# Patient Record
Sex: Female | Born: 1980 | Race: White | Hispanic: No | Marital: Married | Smoking: Current every day smoker
Health system: Southern US, Community
[De-identification: ages and names within clinical notes are randomized; demographics above are authoritative.]

## PROBLEM LIST (undated history)

## (undated) ENCOUNTER — Inpatient Hospital Stay (HOSPITAL_COMMUNITY): Payer: Self-pay

## (undated) DIAGNOSIS — R87629 Unspecified abnormal cytological findings in specimens from vagina: Secondary | ICD-10-CM

## (undated) DIAGNOSIS — G8929 Other chronic pain: Secondary | ICD-10-CM

## (undated) DIAGNOSIS — F32A Depression, unspecified: Secondary | ICD-10-CM

## (undated) DIAGNOSIS — Z62810 Personal history of physical and sexual abuse in childhood: Secondary | ICD-10-CM

## (undated) DIAGNOSIS — J45909 Unspecified asthma, uncomplicated: Secondary | ICD-10-CM

## (undated) DIAGNOSIS — K589 Irritable bowel syndrome without diarrhea: Secondary | ICD-10-CM

## (undated) DIAGNOSIS — M549 Dorsalgia, unspecified: Secondary | ICD-10-CM

## (undated) DIAGNOSIS — M5126 Other intervertebral disc displacement, lumbar region: Secondary | ICD-10-CM

## (undated) DIAGNOSIS — F329 Major depressive disorder, single episode, unspecified: Secondary | ICD-10-CM

## (undated) DIAGNOSIS — F1920 Other psychoactive substance dependence, uncomplicated: Secondary | ICD-10-CM

## (undated) DIAGNOSIS — Z62812 Personal history of neglect in childhood: Secondary | ICD-10-CM

## (undated) DIAGNOSIS — J9383 Other pneumothorax: Secondary | ICD-10-CM

## (undated) DIAGNOSIS — F419 Anxiety disorder, unspecified: Secondary | ICD-10-CM

## (undated) DIAGNOSIS — Z62811 Personal history of psychological abuse in childhood: Secondary | ICD-10-CM

## (undated) HISTORY — DX: Personal history of psychological abuse in childhood: Z62.811

## (undated) HISTORY — DX: Other intervertebral disc displacement, lumbar region: M51.26

## (undated) HISTORY — DX: Personal history of neglect in childhood: Z62.812

## (undated) HISTORY — PX: BREAST MASS EXCISION: SHX1267

## (undated) HISTORY — PX: CHEST TUBE INSERTION: SHX231

## (undated) HISTORY — DX: Irritable bowel syndrome without diarrhea: K58.9

## (undated) HISTORY — PX: LEEP: SHX91

## (undated) HISTORY — PX: NO PAST SURGERIES: SHX2092

## (undated) HISTORY — DX: Personal history of physical and sexual abuse in childhood: Z62.810

---

## 1998-12-24 ENCOUNTER — Emergency Department (HOSPITAL_COMMUNITY): Admission: EM | Admit: 1998-12-24 | Discharge: 1998-12-24 | Payer: Self-pay | Admitting: Emergency Medicine

## 2000-08-06 ENCOUNTER — Ambulatory Visit (HOSPITAL_COMMUNITY): Admission: RE | Admit: 2000-08-06 | Discharge: 2000-08-06 | Payer: Self-pay | Admitting: Obstetrics and Gynecology

## 2000-08-06 ENCOUNTER — Encounter (INDEPENDENT_AMBULATORY_CARE_PROVIDER_SITE_OTHER): Payer: Self-pay

## 2001-02-26 ENCOUNTER — Other Ambulatory Visit: Admission: RE | Admit: 2001-02-26 | Discharge: 2001-02-26 | Payer: Self-pay | Admitting: Gynecology

## 2001-08-29 ENCOUNTER — Encounter (INDEPENDENT_AMBULATORY_CARE_PROVIDER_SITE_OTHER): Payer: Self-pay | Admitting: Specialist

## 2001-08-29 ENCOUNTER — Inpatient Hospital Stay (HOSPITAL_COMMUNITY): Admission: AD | Admit: 2001-08-29 | Discharge: 2001-09-01 | Payer: Self-pay | Admitting: Gynecology

## 2001-10-15 ENCOUNTER — Other Ambulatory Visit: Admission: RE | Admit: 2001-10-15 | Discharge: 2001-10-15 | Payer: Self-pay | Admitting: Gynecology

## 2002-12-14 ENCOUNTER — Other Ambulatory Visit: Admission: RE | Admit: 2002-12-14 | Discharge: 2002-12-14 | Payer: Self-pay | Admitting: *Deleted

## 2003-09-22 ENCOUNTER — Other Ambulatory Visit: Admission: RE | Admit: 2003-09-22 | Discharge: 2003-09-22 | Payer: Self-pay | Admitting: Obstetrics and Gynecology

## 2004-06-05 ENCOUNTER — Other Ambulatory Visit: Admission: RE | Admit: 2004-06-05 | Discharge: 2004-06-05 | Payer: Self-pay | Admitting: Obstetrics and Gynecology

## 2005-01-16 ENCOUNTER — Other Ambulatory Visit: Admission: RE | Admit: 2005-01-16 | Discharge: 2005-01-16 | Payer: Self-pay | Admitting: Obstetrics and Gynecology

## 2005-04-10 ENCOUNTER — Other Ambulatory Visit: Admission: RE | Admit: 2005-04-10 | Discharge: 2005-04-10 | Payer: Self-pay | Admitting: Obstetrics and Gynecology

## 2005-10-02 ENCOUNTER — Other Ambulatory Visit: Admission: RE | Admit: 2005-10-02 | Discharge: 2005-10-02 | Payer: Self-pay | Admitting: Obstetrics and Gynecology

## 2010-03-03 ENCOUNTER — Ambulatory Visit: Payer: Self-pay | Admitting: Family Medicine

## 2010-03-03 LAB — CONVERTED CEMR LAB
Glucose, Urine, Semiquant: NEGATIVE
Ketones, urine, test strip: NEGATIVE
Nitrite: NEGATIVE
Specific Gravity, Urine: 1.015
WBC Urine, dipstick: NEGATIVE

## 2010-12-14 ENCOUNTER — Encounter
Admission: RE | Admit: 2010-12-14 | Discharge: 2010-12-14 | Payer: Self-pay | Source: Home / Self Care | Attending: Gastroenterology | Admitting: Gastroenterology

## 2011-01-23 NOTE — Assessment & Plan Note (Signed)
Summary: ABDOMINAL PAIN/KH   Vital Signs:  Patient Profile:   30 Years Old Female CC:      left side pain aching with shooting pain X 4 days with urianry frequency and urgency Height:     68 inches Weight:      160 pounds O2 Sat:      99 % O2 treatment:    Room Air Temp:     98.9 degrees F oral Pulse rate:   87 / minute Resp:     18 per minute BP sitting:   126 / 70  (right arm)  Pt. in pain?   yes    Location:   left side    Intensity:   6    Type:       aching  Vitals Entered By: Lajean Saver RN (March 03, 2010 1:03 PM)                   Current Allergies: No known allergies History of Present Illness Chief Complaint: left side pain aching with shooting pain X 4 days with Joy Murray frequency and urgency History of Present Illness: Subjective:  Patient complains of increased urinary frequency and nocturia for about a month without dysuria.  Over the past 4 days she has had vague discomfort in her left upper abdomen that radiates to her left flank. No fever/chills.  No cough or shortness of breath.  No hematuria.  No nausea/vomiting.  No change in bowel movements.  No vaginal discharge or pelvic pain.  She has had amenorrhea since placement of an IUD 8 years ago.  She has a history of abnormal Pap smear several years ago with subsequent cervical conization.  A repeat Pap smear about 2 years ago was normal.  No weight loss.  REVIEW OF SYSTEMS Constitutional Symptoms      Denies fever, chills, night sweats, weight loss, weight gain, and fatigue.      Comments: pain to left side Eyes       Denies change in vision, eye pain, eye discharge, glasses, contact lenses, and eye surgery. Ear/Nose/Throat/Mouth       Denies hearing loss/aids, change in hearing, ear pain, ear discharge, dizziness, frequent runny nose, frequent nose bleeds, sinus problems, sore throat, hoarseness, and tooth pain or bleeding.  Respiratory       Denies dry cough, productive cough, wheezing, shortness of  breath, asthma, bronchitis, and emphysema/COPD.  Cardiovascular       Denies murmurs, chest pain, and tires easily with exhertion.    Gastrointestinal       Denies stomach pain, nausea/vomiting, diarrhea, constipation, blood in bowel movements, and indigestion. Genitourniary       Complains of loss of urinary control.      Denies painful urination and kidney stones. Neurological       Denies paralysis, seizures, and fainting/blackouts. Musculoskeletal       Denies muscle pain, joint pain, joint stiffness, decreased range of motion, redness, swelling, muscle weakness, and gout.  Skin       Denies bruising, unusual mles/lumps or sores, and hair/skin or nail changes.  Psych       Denies mood changes, temper/anger issues, anxiety/stress, speech problems, depression, and sleep problems. 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  No adenopathy is present.  No thyromegaly is present  Lungs:  Clear to auscultation.  Breath sounds are equal.  Heart:  Regular rate and rhythm without murmurs, rubs, or gallops.  Abdomen:   Vague tenderness over uterus and bladder without masses or hepatosplenomegaly.  Bowel sounds are present.   Vague left flank tenderness. Genitourinary:  Vulva appears normal without lesions or erythema.  Vagina has normal mucosae without lesions.  There is a small amount of thin grayish discharge in the vaginal vault.  Cervix appears normal without lesions and no discharge.   IUD string is present in cervical os.  No cervical motion tenderness present.  Uterus seems somewhat full and slightly tender to manipulation.   Adnexae also slightly tender to manipulation but no masses.  Rectal exam negative urinalysis (dipstick):  negative Urine pregnancy test:  negative Assessment New Problems: PELVIC  PAIN (ICD-789.09)   Plan New Medications/Changes: LORTAB 5 5-500 MG TABS (HYDROCODONE-ACETAMINOPHEN) One tab by mouth hs as needed pain  #8 (eight) x 0, 03/03/2010, Donna Christen MD METRONIDAZOLE 500 MG TABS (METRONIDAZOLE) One by mouth bid  #14 x 0, 03/03/2010, Donna Christen MD  New Orders: Urinalysis [81003-65000] Pelvic Ultrasound [Sono Pelvis] T-Pap Smear, Thin Prep [99371] Urine Pregnancy Test  [81025] New Patient Level III [69678] Planning Comments:   Pap smear done. Will treat empirically for bacterial vaginosis.  Analgesic for bedtime. Schedule pelvic ultrasound. Return for worsening symptoms  Follow-up with GYN   The patient and/or caregiver has been counseled thoroughly with regard to medications prescribed including dosage, schedule, interactions, rationale for use, and possible side effects and they verbalize understanding.  Diagnoses and expected course of recovery discussed and will return if not improved as expected or if the condition worsens. Patient and/or caregiver verbalized understanding.  Prescriptions: LORTAB 5 5-500 MG TABS (HYDROCODONE-ACETAMINOPHEN) One tab by mouth hs as needed pain  #8 (eight) x 0   Entered and Authorized by:   Donna Christen MD   Signed by:   Donna Christen MD on 03/03/2010   Method used:   Print then Give to Patient   RxID:   [id number] METRONIDAZOLE 500 MG TABS (METRONIDAZOLE) One by mouth bid  #14 x 0   Entered and Authorized by:   Donna Christen MD   Signed by:   Donna Christen MD on 03/03/2010   Method used:   Print then Give to Patient   RxID:   [id number]   Patient Instructions: 1)  May take  Ibuprofen 200mg , 4 tabs every 8 hours with food   Laboratory Results   Urine Tests  Date/Time Received: March 03, 2010 1:35 PM  Date/Time Reported: March 03, 2010 1:35 PM   Routine Urinalysis   Color: yellow Appearance: Clear Glucose: negative   (Normal Range: Negative) Bilirubin: negative   (Normal Range: Negative) Ketone: negative   (Normal Range: Negative) Spec. Gravity: 1.015   (Normal Range: 1.003-1.035) Blood: trace-intact   (Normal Range: Negative) pH: 6.5   (Normal Range: 5.0-8.0) Protein: negative   (Normal Range: Negative) Urobilinogen: 1.0   (Normal Range: 0-1) Nitrite: negative   (Normal Range: Negative) Leukocyte Esterace: negative   (Normal Range: Negative)    Urine HCG: negative

## 2011-05-11 NOTE — Discharge Summary (Signed)
[facility]  Patient:    Joy Murray, PRIM Visit Number: [id number] MRN: [id number]          Service Type: OBS Location: 910A [id number] Attending Physician:  Tonye Royalty Dictated by:   Antony Contras, [facility] Admit Date:  08/29/2001 Discharge Date: 09/01/2001                             Discharge Summary  DISCHARGE DIAGNOSES:          1. Intrauterine pregnancy at 38-6/7 weeks.                               2. Premature rupture of membranes.  PROCEDURES:                   1. Induction of labor.                               2. Vacuum assisted vaginal delivery of viable                                  infant over a midline episiotomy.  HISTORY OF PRESENT ILLNESS:   The patient is a 30 year old, gravida 3, para 1-0-1-1, LMP November 30, 2000, [facility] September 06, 2001. Prenatal course was uncomplicated.  PRENATAL LABORATORY DATA:     Blood type A positive, antibody screen negative, RPR, HBsAg, HIV nonreactive. MSAFP normal. GBS negative.  HOSPITAL COURSE AND TREATMENT:                The patient presented on August 29, 2001 with a two-day history of leaking fluid. She did at that time have a positive fern test and Nitrazine. Cervix was 50%, closed, -2 station. She was admitted for induction of labor. Artificial rupture of membranes revealed thick meconium-stained fluid. IUPC was placed and amnioinfusion was started. She progressed to complete dilatation. Delivery was accomplished with a Tender Touch vacuum over midline episiotomy. She delivered an Apgar 8/9 female infant weighing 7 pounds. Pediatric team was in attendance. Cord pH was 7.29.  POSTPARTUM COURSE:            She remained afebrile and was able to be discharged on her second postpartum day in satisfactory condition.  CBC:  Hematocrit 32.9, hemoglobin 11.6, platelets 257,000.  DISPOSITION:                  The patient is to follow up in six weeks, continue prenatal vitamins and iron  with Motrin and Tylox for pain. Dictated by:   Antony Contras, [facility] Attending Physician:  Tonye Royalty DD:  09/26/01 TD:  09/28/01 Job: [id number]

## 2011-05-11 NOTE — Op Note (Signed)
[facility]  Patient:    Joy Murray, Joy Murray                            MRN: [id number] Proc. Date: 08/06/00 Adm. Date:  [id number] Attending:  Michaele Offer                           Operative Report  PREOPERATIVE DIAGNOSIS:  Missed abortion.  POSTOPERATIVE DIAGNOSIS:  Missed abortion.  PROCEDURE:  Suction dilation and curettage.  SURGEON:  Lavina Hamman, M.D.  ANESTHESIA:  General with an LMA.  ESTIMATED BLOOD LOSS:  100 cc.  FINDINGS:  Eight week size anteverted uterus with a closed cervical os and products of conception were obtained on curettage.  COUNTS:  Correct.  CONDITION:  Stable.  DESCRIPTION OF PROCEDURE:  After appropriate informed consent was obtained, the patient was taken to the operating room and placed in the dorsal supine position. General anesthesia was induced and she was placed in mobile stirrups. Her perineum and vagina were prepped and draped in the usual sterile fashion and her bladder drained with a red rubber catheter. A weighted speculum was inserted into the vagina and the anterior lip of the cervix was grasped with a single tooth tenaculum. The uterus then sounded to approximately 9 cm. The cervix was gradually dilated to a size 18 Pratt dilator. A size 8 curved suction curette was then inserted and suction curettage performed with return of products of conception. Sharp curettage was then performed with a small amount of tissue obtained and after this was obtained there was good uterine cry in all quadrants. Suction curettage was performed one more time to remove all remaining blood. The single tooth tenaculum was removed and the uterus was massaged. The cervix was inspected and found to be hemostatic and the cervical os was closed. All instruments were removed from the vagina and the patient was taken down from stirrups. The patient was awakened in the operating room and taken to the recovery room in stable  condition after tolerating the procedure well. DD:  08/06/00 TD:  08/06/00 Job: [id number]

## 2011-09-17 ENCOUNTER — Encounter: Payer: Self-pay | Admitting: Family Medicine

## 2011-09-17 ENCOUNTER — Inpatient Hospital Stay (INDEPENDENT_AMBULATORY_CARE_PROVIDER_SITE_OTHER)
Admission: RE | Admit: 2011-09-17 | Discharge: 2011-09-17 | Disposition: A | Discharge status: Home / Self Care | Payer: Private Health Insurance - Indemnity | Source: Ambulatory Visit | Attending: Family Medicine | Admitting: Family Medicine

## 2011-09-17 ENCOUNTER — Other Ambulatory Visit: Payer: Self-pay | Admitting: Family Medicine

## 2011-09-17 ENCOUNTER — Ambulatory Visit
Admission: RE | Admit: 2011-09-17 | Discharge: 2011-09-17 | Disposition: A | Discharge status: Home / Self Care | Payer: Private Health Insurance - Indemnity | Source: Ambulatory Visit | Attending: Family Medicine | Admitting: Family Medicine

## 2011-09-17 DIAGNOSIS — J9801 Acute bronchospasm: Secondary | ICD-10-CM

## 2011-09-17 DIAGNOSIS — J069 Acute upper respiratory infection, unspecified: Secondary | ICD-10-CM

## 2011-09-17 DIAGNOSIS — R0602 Shortness of breath: Secondary | ICD-10-CM

## 2011-09-17 DIAGNOSIS — R05 Cough: Secondary | ICD-10-CM

## 2011-09-20 ENCOUNTER — Telehealth (INDEPENDENT_AMBULATORY_CARE_PROVIDER_SITE_OTHER): Payer: Self-pay | Admitting: *Deleted

## 2011-11-26 NOTE — Progress Notes (Signed)
Summary: bad cough/sob Room 5   Vital Signs:  Patient Profile:   30 Years Old Female CC:      Cough/SOB x 3 days worse yesterday Height:     68 inches Weight:      150 pounds O2 Sat:      84 % O2 treatment:    Room Air Temp:     98.6 degrees F oral Pulse rate:   104 / minute Pulse rhythm:   regular Resp:     18 per minute BP sitting:   86 / 51  (left arm) Cuff size:   regular  Vitals Entered By: Emilio Math (September 17, 2011 4:21 PM)                  Current Allergies: No known allergies History of Present Illness Chief Complaint: Cough/SOB x 3 days worse yesterday History of Present Illness:  Subjective: Patient complains of onset of URI symptoms 3 days ago with nasal congestion, scratchy throat, and runny nose.  Her cough increased and today she has developed dyspnea with exertion.  Cough is worse at night.  She has a history of spontaneous pneumothorax twice, last episode May 2012.  She had pneumonia in Feb 2012.  She continues to smoke 1 pack per day.  No history of asthma.   No pleuritic pain No wheezing + nasal congestion ? post-nasal drainage No sinus pain/pressure No itchy/red eyes No earache No hemoptysis + fever/chills/sweats No nausea No vomiting No abdominal pain No diarrhea No skin rashes + fatigue No myalgias + headache    Current Meds CELEXA 40 MG TABS (CITALOPRAM HYDROBROMIDE)  ABILIFY 10 MG TABS (ARIPIPRAZOLE)  ENABLEX 7.5 MG XR24H-TAB (DARIFENACIN HYDROBROMIDE)  VITAMIN D 400 UNIT TABS (CHOLECALCIFEROL)  CEFDINIR 300 MG CAPS (CEFDINIR) 1 by mouth q12hr PREDNISONE 10 MG TABS (PREDNISONE) 2 PO BID for 2 days, then 1 BID for 2 days, then 1 daily for 2 days.  Take PC.  Start Tuesday BENZONATATE 200 MG CAPS (BENZONATATE) One by mouth hs as needed cough PROAIR HFA 108 (90 BASE) MCG/ACT AERS (ALBUTEROL SULFATE) Two inhalations q4-6hr as needed.  Max 12 puffs/day  REVIEW OF SYSTEMS Constitutional Symptoms      Denies fever, chills, night  sweats, weight loss, weight gain, and fatigue.  Eyes       Denies change in vision, eye pain, eye discharge, glasses, contact lenses, and eye surgery. Ear/Nose/Throat/Mouth       Denies hearing loss/aids, change in hearing, ear pain, ear discharge, dizziness, frequent runny nose, frequent nose bleeds, sinus problems, sore throat, hoarseness, and tooth pain or bleeding.  Respiratory       Complains of dry cough and shortness of breath.      Denies productive cough, wheezing, asthma, bronchitis, and emphysema/COPD.  Cardiovascular       Denies murmurs, chest pain, and tires easily with exhertion.    Gastrointestinal       Denies stomach pain, nausea/vomiting, diarrhea, constipation, blood in bowel movements, and indigestion. Genitourniary       Denies painful urination, kidney stones, and loss of urinary control. Neurological       Denies paralysis, seizures, and fainting/blackouts. Musculoskeletal       Denies muscle pain, joint pain, joint stiffness, decreased range of motion, redness, swelling, muscle weakness, and gout.  Skin       Denies bruising, unusual mles/lumps or sores, and hair/skin or nail changes.  Psych       Denies mood changes, temper/anger  issues, anxiety/stress, speech problems, depression, and sleep problems. Other Comments: HX Lung collapse 2006 and 04/2011   Past History:  Past Medical History: spontaneous pneumothorax 2006 and 04/2011  Past Surgical History: Reviewed history from 03/03/2010 and no changes required. Denies surgical history  Family History: Reviewed history from 03/03/2010 and no changes required. 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  Extraocular movement is intact.  Conjunctivae  are not inflamed.  Ears:  Canals normal.  Tympanic membranes normal.   Nose:  Mildly congested turbinates.  No sinus tenderness  Pharynx:  Normal  Neck:  Supple.  No adenopathy is present.   Lungs:  Diffuse bilateral wheezes and rhonchi.   Breath sounds are equal.  Chest:  Tenderness over sternum Heart:  Regular rate and rhythm without murmurs, rubs, or gallops.  Abdomen:  Nontender without masses or hepatosplenomegaly.  Bowel sounds are present.  No CVA or flank tenderness.  Extremities:  No edema.  Skin:  No rash Chest X-ray:  IMPRESSION: No significant abnormality identified. Assessment New Problems: ACUTE BRONCHOSPASM (ICD-519.11) UPPER RESPIRATORY INFECTION, ACUTE, WITH BRONCHITIS (ICD-465.9) DYSPNEA (ICD-786.05) COUGH (ICD-786.2)   Plan New Medications/Changes: PROAIR HFA 108 (90 BASE) MCG/ACT AERS (ALBUTEROL SULFATE) Two inhalations q4-6hr as needed.  Max 12 puffs/day  #1 MDI x 0, 09/17/2011, Donna Christen MD BENZONATATE 200 MG CAPS (BENZONATATE) One by mouth hs as needed cough  #12 x 0, 09/17/2011, Donna Christen MD PREDNISONE 10 MG TABS (PREDNISONE) 2 PO BID for 2 days, then 1 BID for 2 days, then 1 daily for 2 days.  Take PC.  Start Tuesday  #14 x 0, 09/17/2011, Donna Christen MD CEFDINIR 300 MG CAPS (CEFDINIR) 1 by mouth q12hr  #20 x 0, 09/17/2011, Donna Christen MD  New Orders: T-Chest x-ray, 2 views [71020] Solumedrol up to 125mg  [J2930] CBC w/Diff [56213-08657] Pulse Oximetry [94760] Admin of Therapeutic Inj  intramuscular or subcutaneous [96372] Albuterol Sulfate Sol 1mg  unit dose [J7613] Ipratropium inhalation sol. unit dose [J7644] Nebulizer Tx [94640] Est. Patient Level V [id number] Planning Comments:   O2 by nasal cannula during evaluation.  Pulse ox back up to 94% after neb treatment (oxygen off) Solu Medrol 125mg , begin tapering course of prednisone tomorrow.  Begin ProAir inhaler. Begin Omnicef, expectorant/decongestant, cough suppressant at bedtime.  Increase  fluid intake, rest. Followup with PCP if not improving 7 to 10 days  Return for worsening symptoms (or proceed to ER)   The patient and/or caregiver has been counseled thoroughly with regard to medications prescribed including dosage, schedule, interactions, rationale for use, and possible side effects and they verbalize understanding.  Diagnoses and expected course of recovery discussed and will return if not improved as expected or if the condition worsens. Patient and/or caregiver verbalized understanding.  Prescriptions: PROAIR HFA 108 (90 BASE) MCG/ACT AERS (ALBUTEROL SULFATE) Two inhalations q4-6hr as needed.  Max 12 puffs/day  #1 MDI x 0   Entered and Authorized by:   Donna Christen MD   Signed by:   Donna Christen MD on 09/17/2011   Method used:   Print then Give to Patient   RxID:   [id number] BENZONATATE 200 MG CAPS (BENZONATATE) One by mouth hs as needed cough  #12 x 0   Entered and Authorized by:  Donna Christen MD   Signed by:   Donna Christen MD on 09/17/2011   Method used:   Print then Give to Patient   RxID:   [id number] PREDNISONE 10 MG TABS (PREDNISONE) 2 PO BID for 2 days, then 1 BID for 2 days, then 1 daily for 2 days.  Take PC.  Start Tuesday  #14 x 0   Entered and Authorized by:   Donna Christen MD   Signed by:   Donna Christen MD on 09/17/2011   Method used:   Print then Give to Patient   RxID:   [id number] CEFDINIR 300 MG CAPS (CEFDINIR) 1 by mouth q12hr  #20 x 0   Entered and Authorized by:   Donna Christen MD   Signed by:   Donna Christen MD on 09/17/2011   Method used:   Print then Give to Patient   RxID:   [id number]   Patient Instructions: 1)  Take Mucinex D (guaifenesin with decongestant) twice daily for congestion. 2)  Increase fluid intake, rest. 3)  May use Afrin nasal spray (or generic oxymetazoline) twice daily for about 5 days.  Also recommend using saline nasal spray several times daily and/or saline nasal irrigation. 4)  Followup  with family doctor if not improving 7 to 10 days.   Medication Administration  Injection # 1:    Medication: Solumedrol up to 125mg     Diagnosis: ACUTE BRONCHOSPASM (ICD-519.11)    Route: IM    Site: LUOQ gluteus    Exp Date: 04/23/2014    Lot #: [id number]    Mfr: Pfizer    Comments: 125 mgs given    Patient tolerated injection without complications    Given by: Emilio Math (September 17, 2011 6:00 PM)  Medication # 1:    Medication: Albuterol Sulfate Sol 1mg  unit dose    Diagnosis: ACUTE BRONCHOSPASM (ICD-519.11)    Dose: 0.5    Route: inhaled    Exp Date: 04/23/2012    Lot #: [id number]    Mfr: Mylan    Comments: DuoNeb    Patient tolerated medication without complications    Given by: Emilio Math (September 17, 2011 6:01 PM)  Medication # 2:    Medication: Ipratropium inhalation sol. unit dose    Diagnosis: ACUTE BRONCHOSPASM (ICD-519.11)    Dose: 3.0    Route: inhaled    Exp Date: 04/23/2012    Lot #: [id number]    Mfr: Mylan    Comments: Duoneb    Patient tolerated medication without complications    Given by: Emilio Math (September 17, 2011 6:03 PM)  Orders Added: 1)  T-Chest x-ray, 2 views [71020] 2)  Solumedrol up to 125mg  [J2930] 3)  CBC w/Diff [53664-40347] 4)  Pulse Oximetry [94760] 5)  Admin of Therapeutic Inj  intramuscular or subcutaneous [96372] 6)  Albuterol Sulfate Sol 1mg  unit dose [J7613] 7)  Ipratropium inhalation sol. unit dose [J7644] 8)  Nebulizer Tx [94640] 9)  Est. Patient Level V [42595]     Medication Administration  Injection # 1:    Medication: Solumedrol up to 125mg     Diagnosis: ACUTE BRONCHOSPASM (ICD-519.11)    Route: IM    Site: LUOQ gluteus    Exp Date: 04/23/2014    Lot #: [id number]    Mfr: Pfizer    Comments: 125 mgs given    Patient tolerated injection without complications    Given by: Emilio Math (September 17, 2011 6:00 PM)  Medication # 1:    Medication: Albuterol  Sulfate Sol 1mg  unit dose    Diagnosis: ACUTE  BRONCHOSPASM (ICD-519.11)    Dose: 0.5    Route: inhaled    Exp Date: 04/23/2012    Lot #: [id number]    Mfr: Mylan    Comments: DuoNeb    Patient tolerated medication without complications    Given by: Emilio Math (September 17, 2011 6:01 PM)  Medication # 2:    Medication: Ipratropium inhalation sol. unit dose    Diagnosis: ACUTE BRONCHOSPASM (ICD-519.11)    Dose: 3.0    Route: inhaled    Exp Date: 04/23/2012    Lot #: [id number]    Mfr: Mylan    Comments: Duoneb    Patient tolerated medication without complications    Given by: Emilio Math (September 17, 2011 6:03 PM)  Orders Added: 1)  T-Chest x-ray, 2 views [71020] 2)  Solumedrol up to 125mg  [J2930] 3)  CBC w/Diff [16109-60454] 4)  Pulse Oximetry [94760] 5)  Admin of Therapeutic Inj  intramuscular or subcutaneous [96372] 6)  Albuterol Sulfate Sol 1mg  unit dose [J7613] 7)  Ipratropium inhalation sol. unit dose [J7644] 8)  Nebulizer Tx [94640] 9)  Est. Patient Level V [09811]   Appended Document: bad cough/sob Room 5 CBC:  WBC 8.5 LY 18.0, MO 6.8, GR 75.2; Hgb 12.9

## 2011-11-26 NOTE — Telephone Encounter (Signed)
  Phone Note Outgoing Call Call back at [facility] Phone [phone]   Call placed by: Lajean Saver RN,  September 20, 2011 3:43 PM Call placed to: Patient Summary of Call: Callback: No answer. Message left to call with questions or concerns

## 2013-03-31 ENCOUNTER — Other Ambulatory Visit: Payer: Self-pay | Admitting: Pain Medicine

## 2013-03-31 DIAGNOSIS — M546 Pain in thoracic spine: Secondary | ICD-10-CM

## 2013-03-31 DIAGNOSIS — M545 Low back pain: Secondary | ICD-10-CM

## 2013-04-04 ENCOUNTER — Other Ambulatory Visit: Payer: Managed Care, Other (non HMO)

## 2013-04-15 ENCOUNTER — Ambulatory Visit
Admission: RE | Admit: 2013-04-15 | Discharge: 2013-04-15 | Disposition: A | Discharge status: Home / Self Care | Payer: BC Managed Care – PPO | Source: Ambulatory Visit | Attending: Pain Medicine | Admitting: Pain Medicine

## 2013-04-15 DIAGNOSIS — M545 Low back pain, unspecified: Secondary | ICD-10-CM

## 2013-04-15 DIAGNOSIS — M546 Pain in thoracic spine: Secondary | ICD-10-CM

## 2013-09-07 ENCOUNTER — Emergency Department (INDEPENDENT_AMBULATORY_CARE_PROVIDER_SITE_OTHER): Payer: BC Managed Care – PPO

## 2013-09-07 ENCOUNTER — Emergency Department
Admission: EM | Admit: 2013-09-07 | Discharge: 2013-09-07 | Disposition: A | Discharge status: Home / Self Care | Payer: BC Managed Care – PPO | Source: Home / Self Care | Attending: Family Medicine | Admitting: Family Medicine

## 2013-09-07 ENCOUNTER — Encounter: Payer: Self-pay | Admitting: *Deleted

## 2013-09-07 DIAGNOSIS — Z716 Tobacco abuse counseling: Secondary | ICD-10-CM

## 2013-09-07 DIAGNOSIS — R062 Wheezing: Secondary | ICD-10-CM

## 2013-09-07 DIAGNOSIS — J069 Acute upper respiratory infection, unspecified: Secondary | ICD-10-CM

## 2013-09-07 DIAGNOSIS — J40 Bronchitis, not specified as acute or chronic: Secondary | ICD-10-CM

## 2013-09-07 DIAGNOSIS — R05 Cough: Secondary | ICD-10-CM

## 2013-09-07 HISTORY — DX: Major depressive disorder, single episode, unspecified: F32.9

## 2013-09-07 HISTORY — DX: Other pneumothorax: J93.83

## 2013-09-07 HISTORY — DX: Anxiety disorder, unspecified: F41.9

## 2013-09-07 HISTORY — DX: Other chronic pain: G89.29

## 2013-09-07 HISTORY — DX: Dorsalgia, unspecified: M54.9

## 2013-09-07 HISTORY — DX: Depression, unspecified: F32.A

## 2013-09-07 MED ORDER — METHYLPREDNISOLONE SODIUM SUCC 125 MG IJ SOLR
125.0000 mg | Freq: Once | INTRAMUSCULAR | Status: AC
  Administered 2013-09-07: 125 mg via INTRAMUSCULAR

## 2013-09-07 MED ORDER — PREDNISONE 50 MG PO TABS: ORAL_TABLET | ORAL | Status: DC

## 2013-09-07 MED ORDER — AZITHROMYCIN 250 MG PO TABS: ORAL_TABLET | ORAL | Status: DC

## 2013-09-07 MED ORDER — BENZONATATE 100 MG PO CAPS: 100.0000 mg | ORAL_CAPSULE | Freq: Three times a day (TID) | ORAL | Status: DC | PRN

## 2013-09-07 NOTE — ED Provider Notes (Signed)
CSN: [id number]     Arrival date & time 09/07/13  1911 History   First MD Initiated Contact with Patient 09/07/13 1913     Chief Complaint  Patient presents with  . Cough  . Nasal Congestion  . Headache    HPI  URI Symptoms Onset: 2-3 days  Description: rhinorrhea, nasal congestion, cough, SOB, wheezing Modifying factors:  1/2 PPD smoker, prior hx/o PTX x2, last occurrence 3 years ago.   Symptoms Nasal discharge: yes Fever: no Sore throat: no Cough: yes Wheezing: yes Ear pain: no GI symptoms: no Sick contacts: no  Red Flags  Stiff neck: no Dyspnea: yes Rash: no Swallowing difficulty: no  Sinusitis Risk Factors Headache/face pain: no Double sickening: no tooth pain: no  Allergy Risk Factors Sneezing: no Itchy scratchy throat: no Seasonal symptoms: no  Flu Risk Factors Headache: no muscle aches: no severe fatigue: no   No past medical history on file. No past surgical history on file. No family history on file. History  Substance Use Topics  . Smoking status: Not on file  . Smokeless tobacco: Not on file  . Alcohol Use: Not on file   OB History   No data available     Review of Systems  All other systems reviewed and are negative.    Allergies  Review of patient's allergies indicates no known allergies.  Home Medications   Current Outpatient Rx  Name  Route  Sig  Dispense  Refill  . ALPRAZolam (XANAX) 0.5 MG tablet   Oral   Take 0.5 mg by mouth at bedtime as needed for sleep.         . ARIPiprazole (ABILIFY) 30 MG tablet   Oral   Take 50 mg by mouth daily.         . Cholecalciferol (VITAMIN D PO)   Oral   Take by mouth.         Marland Kitchen HYDROcodone-acetaminophen (LORTAB) 10-500 MG per tablet   Oral   Take 1 tablet by mouth every 6 (six) hours as needed for pain.          BP 113/73  Pulse 96  Temp(Src) 98.2 F (36.8 C) (Oral)  Resp 18  Ht 5\' 8"  (1.727 m)  Wt 152 lb (68.947 kg)  BMI 23.12 kg/m2  SpO2 98% Physical Exam   Constitutional: She appears well-developed and well-nourished.  HENT:  Head: Normocephalic and atraumatic.  Right Ear: External ear normal.  Left Ear: External ear normal.  +nasal erythema, rhinorrhea bilaterally, + post oropharyngeal erythema    Eyes: Conjunctivae are normal. Pupils are equal, round, and reactive to light.  Neck: Normal range of motion. Neck supple.  Cardiovascular: Normal rate and regular rhythm.   Pulmonary/Chest: Effort normal. She has wheezes. She has rales.  Abdominal: Soft.  Musculoskeletal: Normal range of motion.  Neurological: She is alert.  Skin: Skin is warm.    ED Course  Procedures (including critical care time) Labs Review Labs Reviewed - No data to display Imaging Review No results found.  MDM   1. URI (upper respiratory infection)   2. Bronchitis   3. Wheezing   4. Tobacco abuse counseling    Likely viral induced obstructive lung disease exacerbation. X-ray without any signs of pneumothorax. Solu-Medrol 125 mg IM x1 for wheezing. Will place on a short course of prednisone. Z-Pak for atypical coverage. Tessalon Perles for cough. Discuss infectious as well as pulmonary red flags. If patient develops any sudden onset of severe shortness  of breath patient is to go to the hospital for review to be evaluated for pneumothorax. Patient expressed understanding of plan. Followup as needed.     The patient and/or caregiver has been counseled thoroughly with regard to treatment plan and/or medications prescribed including dosage, schedule, interactions, rationale for use, and possible side effects and they verbalize understanding. Diagnoses and expected course of recovery discussed and will return if not improved as expected or if the condition worsens. Patient and/or caregiver verbalized understanding.         Doree Albee, MD 10/01/13 1143

## 2013-09-07 NOTE — ED Notes (Signed)
Pt c/o productive cough, nasal congestion, HA and center of chest hurts x 3 days. She has taken Theraflu. Denies fever.

## 2013-10-20 DIAGNOSIS — M5126 Other intervertebral disc displacement, lumbar region: Secondary | ICD-10-CM

## 2013-10-20 HISTORY — DX: Other intervertebral disc displacement, lumbar region: M51.26

## 2014-01-10 DIAGNOSIS — G894 Chronic pain syndrome: Secondary | ICD-10-CM | POA: Insufficient documentation

## 2014-04-20 ENCOUNTER — Encounter (HOSPITAL_COMMUNITY): Payer: Self-pay | Admitting: Psychology

## 2014-04-21 ENCOUNTER — Other Ambulatory Visit (HOSPITAL_COMMUNITY): Payer: BC Managed Care – PPO | Attending: Psychiatry

## 2014-04-21 DIAGNOSIS — F1910 Other psychoactive substance abuse, uncomplicated: Secondary | ICD-10-CM

## 2014-04-21 DIAGNOSIS — Z62812 Personal history of neglect in childhood: Secondary | ICD-10-CM

## 2014-04-21 DIAGNOSIS — F10239 Alcohol dependence with withdrawal, unspecified: Secondary | ICD-10-CM

## 2014-04-21 DIAGNOSIS — F11229 Opioid dependence with intoxication, unspecified: Secondary | ICD-10-CM

## 2014-04-21 DIAGNOSIS — Z62811 Personal history of psychological abuse in childhood: Secondary | ICD-10-CM

## 2014-04-21 DIAGNOSIS — F1120 Opioid dependence, uncomplicated: Secondary | ICD-10-CM | POA: Insufficient documentation

## 2014-04-21 DIAGNOSIS — Z62810 Personal history of physical and sexual abuse in childhood: Secondary | ICD-10-CM

## 2014-04-21 DIAGNOSIS — F4310 Post-traumatic stress disorder, unspecified: Secondary | ICD-10-CM

## 2014-04-21 DIAGNOSIS — F1920 Other psychoactive substance dependence, uncomplicated: Secondary | ICD-10-CM

## 2014-04-21 DIAGNOSIS — Z8659 Personal history of other mental and behavioral disorders: Secondary | ICD-10-CM

## 2014-04-21 DIAGNOSIS — F1994 Other psychoactive substance use, unspecified with psychoactive substance-induced mood disorder: Secondary | ICD-10-CM | POA: Insufficient documentation

## 2014-04-21 HISTORY — DX: Personal history of neglect in childhood: Z62.812

## 2014-04-21 HISTORY — DX: Personal history of physical and sexual abuse in childhood: Z62.810

## 2014-04-21 HISTORY — DX: Personal history of psychological abuse in childhood: Z62.811

## 2014-04-21 MED ORDER — CITALOPRAM HYDROBROMIDE 40 MG PO TABS: 40.0000 mg | ORAL_TABLET | Freq: Every day | ORAL | Status: DC

## 2014-04-21 MED ORDER — HYDROXYZINE PAMOATE 25 MG PO CAPS
25.0000 mg | ORAL_CAPSULE | Freq: Four times a day (QID) | ORAL | Status: DC | PRN
Start: 2014-04-21 — End: 2018-08-20

## 2014-04-21 MED ORDER — TRAZODONE HCL 50 MG PO TABS: ORAL_TABLET | ORAL | Status: DC

## 2014-04-21 MED ORDER — ARIPIPRAZOLE 10 MG PO TABS: 10.0000 mg | ORAL_TABLET | Freq: Every day | ORAL | Status: DC

## 2014-04-21 MED ORDER — ACAMPROSATE CALCIUM 333 MG PO TBEC: 666.0000 mg | DELAYED_RELEASE_TABLET | Freq: Three times a day (TID) | ORAL | Status: DC
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
## 2014-04-23 ENCOUNTER — Other Ambulatory Visit (HOSPITAL_COMMUNITY): Payer: BC Managed Care – PPO | Attending: Psychiatry | Admitting: Psychology

## 2014-04-23 DIAGNOSIS — F411 Generalized anxiety disorder: Secondary | ICD-10-CM | POA: Insufficient documentation

## 2014-04-23 DIAGNOSIS — F4310 Post-traumatic stress disorder, unspecified: Secondary | ICD-10-CM | POA: Insufficient documentation

## 2014-04-23 DIAGNOSIS — F1920 Other psychoactive substance dependence, uncomplicated: Secondary | ICD-10-CM | POA: Insufficient documentation

## 2014-04-23 DIAGNOSIS — F3289 Other specified depressive episodes: Secondary | ICD-10-CM | POA: Insufficient documentation

## 2014-04-23 DIAGNOSIS — F329 Major depressive disorder, single episode, unspecified: Secondary | ICD-10-CM | POA: Insufficient documentation

## 2014-04-23 DIAGNOSIS — F1120 Opioid dependence, uncomplicated: Secondary | ICD-10-CM

## 2014-04-23 DIAGNOSIS — F1020 Alcohol dependence, uncomplicated: Secondary | ICD-10-CM | POA: Insufficient documentation

## 2014-04-23 DIAGNOSIS — IMO0002 Reserved for concepts with insufficient information to code with codable children: Secondary | ICD-10-CM | POA: Insufficient documentation

## 2014-04-23 DIAGNOSIS — Z8659 Personal history of other mental and behavioral disorders: Secondary | ICD-10-CM

## 2014-04-23 DIAGNOSIS — F1420 Cocaine dependence, uncomplicated: Secondary | ICD-10-CM | POA: Insufficient documentation

## 2014-04-26 ENCOUNTER — Other Ambulatory Visit (HOSPITAL_COMMUNITY): Payer: BC Managed Care – PPO

## 2014-04-26 ENCOUNTER — Telehealth (HOSPITAL_COMMUNITY): Payer: Self-pay | Admitting: Psychology

## 2014-04-26 ENCOUNTER — Encounter (HOSPITAL_COMMUNITY): Payer: Self-pay | Admitting: Psychology

## 2014-04-26 ENCOUNTER — Telehealth (HOSPITAL_COMMUNITY): Payer: Self-pay | Admitting: *Deleted

## 2014-04-26 NOTE — Progress Notes (Signed)
    Daily Group Progress Note  Program: CD-IOP   Group Time: 1-2:30 pm  Participation Level: Minimal  Behavioral Response: Sharing  Type of Therapy: Process Group  Topic: Group process. The first part of group was spent in process. Members shared about current issues and concerns. A new member 'introduced' herself and gave the group a little about her history and what she needs now. Another member shared about his concerns about the upcoming weekend. Another member arrived late and disclosed very personal things about the night before and, indirectly, her childhood. The session was very personal with members becoming very vulnerable through their sharing.   Group Time: 2:45- 4pm  Participation Level: Minimal  Behavioral Response: Sharing  Type of Therapy: Psycho-education Group  Topic: Psycho-Ed: the second half of group was spent in an educational piece on "12-Step Slogans". A handout was provided to group members identifying over 50 AA slogans. The group read over the handout and discussed each slogan. Individual members were chosen and they explained, to the best of their ability, what the slogan meant. The session was informative and funny with members sharing their interpretations, many of which were not very accurate. As the session came to an end, members observed and celebrated a graduation. There were kind and hopeful words shared as the members said good-bye.   Summary: The patient was present for her second group session, but she had not spoken much or introduced herself in the previous session. She shared her story and it included many years of addiction, primarily benzos and opiates. The patient described her treatment in [place] and return this past Saturday. She had meet with the program director on Wednesday and was able to fill most of the medications she had been taking while in residential treatment. The patient shared that she has two children and they are engaged and enjoy  a good relationship. The patient had some feedback during the session on 'slogans' and she responded well to this intervention. The patient reported her sobriety date is 3/27. Her drug test result from Wednesday was positive for barbiturates. We will explore her records from the treatment center in [place].    Family Program: Family present? No   Name of family member(s):   UDS collected: No Results:   AA/NA attended?: No, but she is new to the program  Sponsor?: No   Lonald Troiani, LCAS
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
## 2014-04-28 ENCOUNTER — Other Ambulatory Visit (HOSPITAL_COMMUNITY): Payer: BC Managed Care – PPO | Admitting: *Deleted

## 2014-04-30 ENCOUNTER — Other Ambulatory Visit (HOSPITAL_COMMUNITY): Payer: BC Managed Care – PPO | Admitting: *Deleted

## 2014-05-01 ENCOUNTER — Encounter (HOSPITAL_COMMUNITY): Payer: Self-pay

## 2014-05-03 ENCOUNTER — Other Ambulatory Visit (HOSPITAL_COMMUNITY): Payer: BC Managed Care – PPO | Admitting: *Deleted

## 2014-05-05 ENCOUNTER — Other Ambulatory Visit (HOSPITAL_COMMUNITY): Payer: BC Managed Care – PPO | Admitting: Psychology

## 2014-05-05 DIAGNOSIS — Z8659 Personal history of other mental and behavioral disorders: Secondary | ICD-10-CM

## 2014-05-05 DIAGNOSIS — F1120 Opioid dependence, uncomplicated: Secondary | ICD-10-CM

## 2014-05-05 DIAGNOSIS — F1920 Other psychoactive substance dependence, uncomplicated: Principal | ICD-10-CM

## 2014-05-06 ENCOUNTER — Encounter (HOSPITAL_COMMUNITY): Payer: Self-pay | Admitting: Psychology
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
## 2014-05-07 ENCOUNTER — Other Ambulatory Visit (HOSPITAL_COMMUNITY): Payer: BC Managed Care – PPO | Admitting: Psychology

## 2014-05-07 DIAGNOSIS — Z62811 Personal history of psychological abuse in childhood: Secondary | ICD-10-CM

## 2014-05-07 DIAGNOSIS — F1920 Other psychoactive substance dependence, uncomplicated: Principal | ICD-10-CM

## 2014-05-07 DIAGNOSIS — F1120 Opioid dependence, uncomplicated: Secondary | ICD-10-CM

## 2014-05-07 DIAGNOSIS — Z62810 Personal history of physical and sexual abuse in childhood: Secondary | ICD-10-CM

## 2014-05-09 ENCOUNTER — Encounter (HOSPITAL_COMMUNITY): Payer: Self-pay | Admitting: Psychology

## 2014-05-09 NOTE — Progress Notes (Signed)
    Daily Group Progress Note  Program: CD-IOP   Group Time: 1-2:30 pm  Participation Level: Minimal  Behavioral Response: Sharing  Type of Therapy: Process Group  Topic: Group Process: the first part of group was spent in process. Members began the session with a check-in followed by a 5 minute breathing/meditation session. Members were then invited to share about current issues and concerns. One member seemed to be crying and the group invited her to share. She disclosed the events earlier today with her as of today, 788 yo daughter. She expressed frustration and fears about her daughter's tendency to overreact and get upset at minor things. The group processed this event and behavior and she received good feedback. A new member was present and he shared a little about himself. At only 33 years of age, his new group members were in awe that he was ready and accepting treatment for his chemical dependency. The first session included good sharing and disclosures among the group. During this time the program director was meeting patients for discharge, medication follow-up, and an initial session with a new member.  Group Time: 2:45- 4pm  Participation Level: Minimal  Behavioral Response: withdrawn, attentive, but not sharing or engaged  Type of Therapy: Psycho-education Group  Topic: Psycho-Ed/Graduation: the second half of group was spent in a psycho-ed about the developmental process of recovery. Members were provided a handout and they read it together. It identified the difference feelings as well as tasks during the first 18 months of recovery. Members who had achieved previous periods of sobriety shared their own experiences while newer members to recovery identified the "pink cloud", 'reality' and other earlier recovery experiences. At the conclusion of the session, a graduation ceremony was held. Members shared kind words and hopes with the graduating member and she provided wonderful  and encouraging words for the program and people she is leaving.   Summary: The patient did not offer anything, but described how she was doing as the "same bullshit, just a different day". She admitted she had worked out at [employer] gym, but couldn't really account for what she had been doing. When another member questioned her about attending meetings, she admitted she had not gone to any since the last session. She had no explanation when another asked why she hadn't gone. The patient provided little of herself and little if any feedback or comments in the session. The patient offered respect and admiration for the graduating member. Her sobriety date is 3/28, but unless she is able to get involved in the Fellowship, she will not be in the program for long.    Family Program: Family present? No   Name of family member(s):   UDS collected: No Results:   AA/NA attended?: No  Sponsor?: No   Lupita Rosales, LCAS

## 2014-05-10 ENCOUNTER — Other Ambulatory Visit (HOSPITAL_COMMUNITY): Payer: BC Managed Care – PPO | Admitting: Psychology

## 2014-05-10 DIAGNOSIS — F1120 Opioid dependence, uncomplicated: Secondary | ICD-10-CM

## 2014-05-10 DIAGNOSIS — F1920 Other psychoactive substance dependence, uncomplicated: Principal | ICD-10-CM

## 2014-05-10 DIAGNOSIS — Z62810 Personal history of physical and sexual abuse in childhood: Secondary | ICD-10-CM

## 2014-05-10 DIAGNOSIS — Z62811 Personal history of psychological abuse in childhood: Secondary | ICD-10-CM

## 2014-05-10 DIAGNOSIS — Z8659 Personal history of other mental and behavioral disorders: Secondary | ICD-10-CM

## 2014-05-12 ENCOUNTER — Encounter (HOSPITAL_COMMUNITY): Payer: Self-pay | Admitting: Psychology

## 2014-05-12 ENCOUNTER — Other Ambulatory Visit (HOSPITAL_COMMUNITY): Payer: BC Managed Care – PPO | Admitting: Psychology

## 2014-05-12 DIAGNOSIS — Z8659 Personal history of other mental and behavioral disorders: Secondary | ICD-10-CM

## 2014-05-12 DIAGNOSIS — F4310 Post-traumatic stress disorder, unspecified: Secondary | ICD-10-CM

## 2014-05-12 DIAGNOSIS — Z62810 Personal history of physical and sexual abuse in childhood: Secondary | ICD-10-CM

## 2014-05-12 DIAGNOSIS — Z62811 Personal history of psychological abuse in childhood: Secondary | ICD-10-CM

## 2014-05-12 DIAGNOSIS — F1920 Other psychoactive substance dependence, uncomplicated: Principal | ICD-10-CM

## 2014-05-12 DIAGNOSIS — F1120 Opioid dependence, uncomplicated: Secondary | ICD-10-CM
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
## 2014-05-14 ENCOUNTER — Other Ambulatory Visit (HOSPITAL_COMMUNITY): Payer: BC Managed Care – PPO | Admitting: *Deleted

## 2014-05-14 DIAGNOSIS — F11229 Opioid dependence with intoxication, unspecified: Secondary | ICD-10-CM

## 2014-05-14 MED ORDER — GABAPENTIN 300 MG PO CAPS: 300.0000 mg | ORAL_CAPSULE | Freq: Four times a day (QID) | ORAL | Status: DC

## 2014-05-14 NOTE — Progress Notes (Unsigned)
Patient ID: Joy Murray, female   DOB: 1981/08/13, 33 y.o.   MRN: [id number]  S-Pt requests to be seen for 1. Medication review and 2 recent onset of vertigo. 3.c/o persistent anxiety/sleep DO -vistaril "does nothing"-so she stopped taking it.  ? About Abilify refill. She is not taking Neurontin yet-record requested fro treatment center never received but pharmacy record now shows outside rx for gabapentin 300 mg #120/month. Her vertigo is spontaneous without known precipitating cause (No hx dehydration/positional component).Has family physician she said is informed of her chemical dependencies  O-Alert. "[place]" affect/appearance.     Oriented.     Speech and thought normal/ comprehensible.No suicidal/homocidal thought/ideation/plan     ? If vertigo is somatic vs physiologic      UDS + for alcohol  A-Needs PCP evaluation of her vertigo complaints    Chronic anxiety-PTSD related-on Celexa- rx for gabapentin now on file    Polysubstance dependence including Opiates    Left IP program AMA PTA here- may need higher level of care  P-Pt to see PCP re vertigo-she is educated to the fact that chemical dependency much like diabetes is a pt managed disease-she needs to be aware of addictive nature of any rx/she is to NOT SEEK and REFUSE prescriptions for outpatient addictive meds unless             supervised by reliable 3rd party     RX for Gabapentin filled sent to pharmacy on record to treat her anxiety     She is informed she has 2 refills on Abilify     She is to be put on zero tolerance for UDS-any more "dirty" urines will result in referral to higher level of care consistent with her pathology

## 2014-05-15 ENCOUNTER — Encounter (HOSPITAL_COMMUNITY): Payer: Self-pay

## 2014-05-18 ENCOUNTER — Encounter (HOSPITAL_COMMUNITY): Payer: Self-pay | Admitting: Psychology

## 2014-05-18 NOTE — Progress Notes (Signed)
    Daily Group Progress Note  Program: CD-IOP   Group Time: 1-2:30 pm  Participation Level: Active  Behavioral Response: Sharing  Type of Therapy: Process Group  Topic: Group process; the first part of group was spent in process. Members shared about issues and concerns in early recovery. Issues included sleep problems, frustrations with family, and making the necessary changes in all aspects of one's life. One member had been absent on Monday and she apologized for not calling. She went on to explain how she recently substituted sex for her cannabis dependence. This generated a discussion on addressing addiction and ensuring that one just isn't transferring it to another chemical or behavior. There was good discussion and feedback among group members.  Group Time: 2:45- 4pm  Participation Level: Minimal  Behavioral Response: Sharing  Type of Therapy: Psycho-education Group  Topic: Denial/Graduation: the second half of group was spent in a psycho-ed on Denial. A handout had been provided on Monday and members offered comments about what they had learned. Members displayed some good insight into denial, using examples from their own lives. As the session neared an end, the group observed a graduation ceremony. An active and well-respected member was leaving today and she received good support and validation as she moves into another phase of this lifestyle.   Summary: The patient reported she had gotten up early, eaten breakfast and gone to the gym for 2 hours. This news was applauded. She admitted she felt really good and seemed very pleased with the validation and positive feedback. When asked about attending any meetings, she denied having attended any since the weekend. I reminded her that 4 meetings per week were required and if she didn't want to do that, she could return to work. The patient stated she would get in her 4 meetings. The patient reported she had not really had any  denial because she knew she was an addict quite a while ago. i pointed out that she is obviously in denial about her alcoholism since she had wine over the weekend. The patient shared words of hope with the graduating member. This woman still remains closed with the group and it is unclear how willing she is to really open and even risk vulnerability. Her sobriety date is 5/11.    Family Program: Family present? No   Name of family member(s):   UDS collected: No Results:   AA/NA attended?: No  Sponsor?: No   Juergen Hardenbrook, LCAS

## 2014-05-19 ENCOUNTER — Other Ambulatory Visit (HOSPITAL_COMMUNITY): Payer: BC Managed Care – PPO

## 2014-05-21 ENCOUNTER — Encounter (HOSPITAL_COMMUNITY): Payer: Self-pay | Admitting: Psychology

## 2014-05-21 ENCOUNTER — Other Ambulatory Visit (HOSPITAL_COMMUNITY): Payer: BC Managed Care – PPO | Admitting: Psychology

## 2014-05-23 ENCOUNTER — Telehealth (HOSPITAL_COMMUNITY): Payer: Self-pay | Admitting: Psychology

## 2014-05-23 NOTE — Progress Notes (Unsigned)
Joy Murray  CD-IOP: The patient did not appear for group today. She had been excused from group on Wednesday because she and her S/O had made plans to go to the beach from Tuesday through yesterday. She did not call to explain her absence. This will go down as an absence and represents her 2nd one.         Sincerity Cedar, LCAS

## 2014-05-24 ENCOUNTER — Other Ambulatory Visit (HOSPITAL_COMMUNITY): Payer: BC Managed Care – PPO | Attending: Psychiatry

## 2014-05-26 ENCOUNTER — Other Ambulatory Visit (HOSPITAL_COMMUNITY): Payer: BC Managed Care – PPO

## 2014-05-27 DIAGNOSIS — F1010 Alcohol abuse, uncomplicated: Secondary | ICD-10-CM | POA: Insufficient documentation

## 2014-05-27 DIAGNOSIS — F339 Major depressive disorder, recurrent, unspecified: Secondary | ICD-10-CM | POA: Insufficient documentation

## 2014-05-28 ENCOUNTER — Other Ambulatory Visit (HOSPITAL_COMMUNITY): Payer: BC Managed Care – PPO

## 2014-05-31 ENCOUNTER — Other Ambulatory Visit (HOSPITAL_COMMUNITY): Payer: BC Managed Care – PPO

## 2014-06-02 ENCOUNTER — Other Ambulatory Visit (HOSPITAL_COMMUNITY): Payer: BC Managed Care – PPO

## 2014-06-04 ENCOUNTER — Other Ambulatory Visit (HOSPITAL_COMMUNITY): Payer: BC Managed Care – PPO

## 2014-06-07 ENCOUNTER — Other Ambulatory Visit (HOSPITAL_COMMUNITY): Payer: BC Managed Care – PPO

## 2014-06-09 ENCOUNTER — Other Ambulatory Visit (HOSPITAL_COMMUNITY): Payer: BC Managed Care – PPO

## 2014-06-11 ENCOUNTER — Other Ambulatory Visit (HOSPITAL_COMMUNITY): Payer: BC Managed Care – PPO

## 2014-06-11 ENCOUNTER — Telehealth (HOSPITAL_COMMUNITY): Payer: Self-pay | Admitting: *Deleted

## 2014-06-14 ENCOUNTER — Other Ambulatory Visit (HOSPITAL_COMMUNITY): Payer: BC Managed Care – PPO

## 2014-07-21 NOTE — Progress Notes (Signed)
    Daily Group Progress Note  Program: CD-IOP   Group Time: 1-2  Participation Level: Active  Behavioral Response: Sharing, Rationalizing, Passive-Aggressive, Evasive and Minimizing  Type of Therapy: Process Group  Topic: Process group: This member returned to group after a no-show/no-call on Monday. The group members spent time at the start of group asking her where she had been and expressing that they had been worried about her. The patient was tearful and admitted that she did not have a good reason for missing group and that she has a lot of fear and anxiety about group and recovery. Other group members were very supportive of her and encouraged her to keep coming back and that they missed her and worried about her when she wasn't there. The group member responded appropriately and appeared to understand and appreciate their concerns.      Group Time: 2-3  Participation Level: Minimal  Behavioral Response: Appropriate  Type of Therapy: Process Group  Topic: process group cont   Summary: The patient was quieter during the second half of group and not as emotional. She admitted that she is scared to go to AA meetings and the group members encouraged her to go and also said that they would be willing to meet her at some. She appeared grateful but quiet for the remainder of group.    Family Program: Family present? No   Name of family member(s):   UDS collected: No Results:   AA/NA attended?: No  Sponsor?: No   [facility]

## 2014-07-23 NOTE — Progress Notes (Signed)
    Daily Group Progress Note  Program: CD-IOP   Group Time: 1-2  Participation Level: Minimal  Behavioral Response: Evasive  Type of Therapy: Process Group  Topic: This patient continues to be extremely quiet during group time. She shares only when prompted and then very minimally. She was able to share with the group that she almost didn't come to CDIOP today because she had not attended any AA meetings which she had said she would do. The patient received good support from members and this Clinical research associatewriter that it is not something to feel shame about but that if she wants to maintain sobriety she is going to have to start attending meetings. She agreed and appeared slightly more engaged after that discussion.     Group Time: 2-3  Participation Level: Minimal  Behavioral Response: Appropriate and Sharing  Type of Therapy: Psycho-education Group  Topic: Recovery Nuggets    Summary: The patient shared that she was most able to relate to the concept that she cannot control what thoughts come into her mind, only which ones to entertain. She did not share much information in addition to this but listened and was attentive. Pt said she plans to attend AA this weekend with her sister.     Family Program: Family present? NA   Name of family member(s):   UDS collected: NA Results:   AA/NA attended?: No  Sponsor?: No   [facility]

## 2014-07-26 NOTE — Progress Notes (Signed)
    Daily Group Progress Note  Program: CD-IOP   Group Time: 1-2  Participation Level: Minimal  Behavioral Response: Appropriate  Type of Therapy: Process Group  Topic: process group: this patient was very quiet and appeared distracted during the first half of group. She was not actively involved in anything that was said and did not engage. She did check in with the same sobriety date- indicating that she has not relapsed.      Group Time: 2-3  Participation Level: Minimal  Behavioral Response: Appropriate and Sharing  Type of Therapy: Process Group  Topic: process group cont   Summary: the patient did share when asked to about how her weekend was. She admitted that she isolated and did not attend any AA or NA meetings. She was challenged by this Clinical research associatewriter as well as other group members to start attending 12-step meetings and was reminded that she cannot stay in IOP unless she does that. She said she understands that but has so much anxiety around going. The patient was reminded by another patient in group that she was supposed to meet her for the meeting this morning and that she didn't. This patient apologized but did not commit to a meeting in the future.    Family Program: Family present? NA   Name of family member(s):   UDS collected: No Results:   AA/NA attended?: No  Sponsor?: No   [facility]

## 2014-10-15 ENCOUNTER — Other Ambulatory Visit (HOSPITAL_COMMUNITY): Payer: Self-pay | Admitting: Medical

## 2018-04-13 ENCOUNTER — Encounter (HOSPITAL_COMMUNITY): Payer: Self-pay

## 2018-04-13 ENCOUNTER — Emergency Department (HOSPITAL_COMMUNITY): Payer: Medicaid Other

## 2018-04-13 ENCOUNTER — Other Ambulatory Visit: Payer: Self-pay

## 2018-04-13 ENCOUNTER — Emergency Department (HOSPITAL_COMMUNITY)
Admission: EM | Admit: 2018-04-13 | Discharge: 2018-04-13 | Disposition: A | Discharge status: Home / Self Care | Payer: Medicaid Other | Attending: Emergency Medicine | Admitting: Emergency Medicine

## 2018-04-13 DIAGNOSIS — Z79899 Other long term (current) drug therapy: Secondary | ICD-10-CM | POA: Diagnosis not present

## 2018-04-13 DIAGNOSIS — R1030 Lower abdominal pain, unspecified: Secondary | ICD-10-CM

## 2018-04-13 DIAGNOSIS — R102 Pelvic and perineal pain: Secondary | ICD-10-CM | POA: Insufficient documentation

## 2018-04-13 DIAGNOSIS — O9989 Other specified diseases and conditions complicating pregnancy, childbirth and the puerperium: Secondary | ICD-10-CM | POA: Diagnosis not present

## 2018-04-13 DIAGNOSIS — O99331 Smoking (tobacco) complicating pregnancy, first trimester: Secondary | ICD-10-CM | POA: Diagnosis not present

## 2018-04-13 DIAGNOSIS — Z3A10 10 weeks gestation of pregnancy: Secondary | ICD-10-CM | POA: Insufficient documentation

## 2018-04-13 DIAGNOSIS — F17200 Nicotine dependence, unspecified, uncomplicated: Secondary | ICD-10-CM | POA: Insufficient documentation

## 2018-04-13 DIAGNOSIS — N939 Abnormal uterine and vaginal bleeding, unspecified: Secondary | ICD-10-CM

## 2018-04-13 DIAGNOSIS — O200 Threatened abortion: Secondary | ICD-10-CM

## 2018-04-13 LAB — WET PREP, GENITAL
CLUE CELLS WET PREP: NONE SEEN
SPERM: NONE SEEN
TRICH WET PREP: NONE SEEN
Yeast Wet Prep HPF POC: NONE SEEN

## 2018-04-13 LAB — CBC
HCT: 33.6 % — ABNORMAL LOW (ref 36.0–46.0)
Hemoglobin: 12 g/dL (ref 12.0–15.0)
MCH: 31.8 pg (ref 26.0–34.0)
MCHC: 35.7 g/dL (ref 30.0–36.0)
MCV: 89.1 fL (ref 78.0–100.0)
PLATELETS: 336 10*3/uL (ref 150–400)
RBC: 3.77 MIL/uL — ABNORMAL LOW (ref 3.87–5.11)
RDW: 12 % (ref 11.5–15.5)
WBC: 10.1 10*3/uL (ref 4.0–10.5)

## 2018-04-13 LAB — BASIC METABOLIC PANEL
ANION GAP: 9 (ref 5–15)
BUN: 8 mg/dL (ref 6–20)
CO2: 22 mmol/L (ref 22–32)
Calcium: 9.2 mg/dL (ref 8.9–10.3)
Chloride: 106 mmol/L (ref 101–111)
Creatinine, Ser: 0.56 mg/dL (ref 0.44–1.00)
GFR calc Af Amer: >60 mL/min (ref >60)
GLUCOSE: 80 mg/dL (ref 65–99)
POTASSIUM: 3.9 mmol/L (ref 3.5–5.1)
Sodium: 137 mmol/L (ref 135–145)

## 2018-04-13 LAB — URINALYSIS, ROUTINE W REFLEX MICROSCOPIC
BILIRUBIN URINE: NEGATIVE
Glucose, UA: NEGATIVE mg/dL
HGB URINE DIPSTICK: NEGATIVE
KETONES UR: NEGATIVE mg/dL
Leukocytes, UA: NEGATIVE
NITRITE: NEGATIVE
PROTEIN: NEGATIVE mg/dL
SPECIFIC GRAVITY, URINE: 1.015 (ref 1.005–1.030)
pH: 7 (ref 5.0–8.0)

## 2018-04-13 LAB — I-STAT BETA HCG BLOOD, ED (MC, WL, AP ONLY): I-stat hCG, quantitative: >2000 m[IU]/mL — ABNORMAL HIGH (ref <5)

## 2018-04-13 LAB — HCG, QUANTITATIVE, PREGNANCY: hCG, Beta Chain, Quant, S: 93107 m[IU]/mL — ABNORMAL HIGH (ref <5)

## 2018-04-13 LAB — OB RESULTS CONSOLE HEPATITIS B SURFACE ANTIGEN: HEP B S AG: NEGATIVE

## 2018-04-13 LAB — OB RESULTS CONSOLE GC/CHLAMYDIA: Gonorrhea: NEGATIVE

## 2018-04-13 LAB — OB RESULTS CONSOLE RPR: RPR: NONREACTIVE

## 2018-04-13 LAB — OB RESULTS CONSOLE RUBELLA ANTIBODY, IGM: Rubella: IMMUNE

## 2018-04-13 LAB — OB RESULTS CONSOLE HIV ANTIBODY (ROUTINE TESTING): HIV: NONREACTIVE

## 2018-04-13 LAB — ABO/RH: ABO/RH(D): B POS

## 2018-04-13 MED ORDER — SODIUM CHLORIDE 0.9 % IV BOLUS
1000.0000 mL | Freq: Once | INTRAVENOUS | Status: AC
  Administered 2018-04-13: 1000 mL via INTRAVENOUS

## 2018-04-13 MED ORDER — ACETAMINOPHEN 325 MG PO TABS
650.0000 mg | ORAL_TABLET | Freq: Once | ORAL | Status: AC
  Administered 2018-04-13: 650 mg via ORAL
  Filled 2018-04-13: qty 2

## 2018-04-13 NOTE — Discharge Instructions (Signed)
Your evaluation today shows an intrauterine regnancy approximately [redacted] weeks gestation with normal heartbeat, your vaginal bleeding is likely a threatened miscarriage, is extremely important that you follow-up in the next 2 to 3 days for repeat evaluation either with your OB/GYN or at the MAU at [facility].  If you have worsening vaginal bleeding or abdominal pain, fevers or chills, or feeling lightheaded, pass out, began having chest pain, palpitations or any other new or concerning symptoms do not hesitate to return to the ED or the MAU.

## 2018-04-13 NOTE — ED Triage Notes (Signed)
Pt states that she was at a restaurant and felt wetness, went to restroom and noticed blood clots. Pt bleeding through jeans.  Pt thinks she is approx [redacted] weeks pregnant. Positive preg test 03/24/18

## 2018-04-13 NOTE — ED Notes (Signed)
Patient transported to Ultrasound 
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
## 2018-04-13 NOTE — ED Notes (Signed)
ED Provider at bedside. 

## 2018-04-14 LAB — CERVICOVAGINAL ANCILLARY ONLY
Chlamydia: NEGATIVE
Neisseria Gonorrhea: NEGATIVE

## 2018-04-21 ENCOUNTER — Other Ambulatory Visit (HOSPITAL_COMMUNITY): Payer: Self-pay | Admitting: Nurse Practitioner

## 2018-04-21 DIAGNOSIS — Z369 Encounter for antenatal screening, unspecified: Secondary | ICD-10-CM

## 2018-04-21 LAB — OB RESULTS CONSOLE VARICELLA ZOSTER ANTIBODY, IGG: Varicella: IMMUNE

## 2018-04-21 LAB — CYSTIC FIBROSIS DIAGNOSTIC STUDY: INTERPRETATION-CFDNA: NEGATIVE

## 2018-04-21 LAB — CYTOLOGY - PAP: Pap: NEGATIVE

## 2018-04-27 ENCOUNTER — Inpatient Hospital Stay (HOSPITAL_COMMUNITY)
Admission: AD | Admit: 2018-04-27 | Discharge: 2018-04-27 | Disposition: A | Discharge status: Home / Self Care | Payer: Medicaid Other | Source: Ambulatory Visit | Attending: Obstetrics & Gynecology | Admitting: Obstetrics & Gynecology

## 2018-04-27 ENCOUNTER — Inpatient Hospital Stay (HOSPITAL_COMMUNITY): Payer: Medicaid Other

## 2018-04-27 ENCOUNTER — Encounter (HOSPITAL_COMMUNITY): Payer: Self-pay | Admitting: *Deleted

## 2018-04-27 DIAGNOSIS — O418X10 Other specified disorders of amniotic fluid and membranes, first trimester, not applicable or unspecified: Secondary | ICD-10-CM

## 2018-04-27 DIAGNOSIS — O468X1 Other antepartum hemorrhage, first trimester: Secondary | ICD-10-CM

## 2018-04-27 DIAGNOSIS — Z3A12 12 weeks gestation of pregnancy: Secondary | ICD-10-CM | POA: Insufficient documentation

## 2018-04-27 DIAGNOSIS — O99331 Smoking (tobacco) complicating pregnancy, first trimester: Secondary | ICD-10-CM | POA: Insufficient documentation

## 2018-04-27 DIAGNOSIS — O209 Hemorrhage in early pregnancy, unspecified: Secondary | ICD-10-CM | POA: Diagnosis present

## 2018-04-27 DIAGNOSIS — O208 Other hemorrhage in early pregnancy: Secondary | ICD-10-CM | POA: Diagnosis not present

## 2018-04-27 DIAGNOSIS — F17210 Nicotine dependence, cigarettes, uncomplicated: Secondary | ICD-10-CM | POA: Diagnosis not present

## 2018-04-27 LAB — CBC
HCT: 31.6 % — ABNORMAL LOW (ref 36.0–46.0)
Hemoglobin: 11 g/dL — ABNORMAL LOW (ref 12.0–15.0)
MCH: 31.3 pg (ref 26.0–34.0)
MCHC: 34.8 g/dL (ref 30.0–36.0)
MCV: 89.8 fL (ref 78.0–100.0)
PLATELETS: 322 10*3/uL (ref 150–400)
RBC: 3.52 MIL/uL — ABNORMAL LOW (ref 3.87–5.11)
RDW: 12.7 % (ref 11.5–15.5)
WBC: 9.5 10*3/uL (ref 4.0–10.5)

## 2018-04-27 MED ORDER — ACETAMINOPHEN 500 MG PO TABS
1000.0000 mg | ORAL_TABLET | Freq: Once | ORAL | Status: AC
  Administered 2018-04-27: 1000 mg via ORAL
  Filled 2018-04-27: qty 2

## 2018-04-27 NOTE — Discharge Instructions (Signed)
Subchorionic Hematoma °A subchorionic hematoma is a gathering of blood between the outer wall of the placenta and the inner wall of the womb (uterus). The placenta is the organ that connects the fetus to the wall of the uterus. The placenta performs the feeding, breathing (oxygen to the fetus), and waste removal (excretory work) of the fetus. °Subchorionic hematoma is the most common abnormality found on a result from ultrasonography done during the first trimester or early second trimester of pregnancy. If there has been little or no vaginal bleeding, early small hematomas usually shrink on their own and do not affect your baby or pregnancy. The blood is gradually absorbed over 1-2 weeks. When bleeding starts later in pregnancy or the hematoma is larger or occurs in an older pregnant woman, the outcome may not be as good. Larger hematomas may get bigger, which increases the chances for miscarriage. Subchorionic hematoma also increases the risk of premature detachment of the placenta from the uterus, preterm (premature) labor, and stillbirth. °Follow these instructions at home: °· Stay on bed rest if your health care provider recommends this. Although bed rest will not prevent more bleeding or prevent a miscarriage, your health care provider may recommend bed rest until you are advised otherwise. °· Avoid heavy lifting (more than 10 lb [4.5 kg]), exercise, sexual intercourse, or douching as directed by your health care provider. °· Keep track of the number of pads you use each day and how soaked (saturated) they are. Write down this information. °· Do not use tampons. °· Keep all follow-up appointments as directed by your health care provider. Your health care provider may ask you to have follow-up blood tests or ultrasound tests or both. °Get help right away if: °· You have severe cramps in your stomach, back, abdomen, or pelvis. °· You have a fever. °· You pass large clots or tissue. Save any tissue for your  health care provider to look at. °· Your bleeding increases or you become lightheaded, feel weak, or have fainting episodes. °This information is not intended to replace advice given to you by your health care provider. Make sure you discuss any questions you have with your health care provider. °Document Released: 03/27/2007 Document Revised: 05/17/2016 Document Reviewed: 07/09/2013 °Elsevier Interactive Patient Education © 2017 [employer] ° °

## 2018-04-27 NOTE — MAU Note (Signed)
[facility] and states was told about a threatened miscar[facility].   Presents to day with vaginal bleeding. RN noted bleeding through clothes and onto wheelchair. Patient taken directly from lobby to room.  Patient states she has been bleeding off and on since her initial visit to [facility]. It got heavier; "felt like I peed on myself" about 1 hour ago. Reports large clots.  +lower abdominal and back pain Pain rating of 9/10 Cramping in nature

## 2018-04-27 NOTE — MAU Provider Note (Signed)
History     CSN: [id number]  Arrival date and time: 04/27/18 1422   First Provider Initiated Contact with Patient 04/27/18 1439      Chief Complaint  Patient presents with  . Vaginal Bleeding   Joy Murray is a 37 y.o. G1P0 at Unknown who presents today with vaginal bleeding. She was seen on 4/21 at [facility] with vaginal bleeding. She was diagnosed with [facility] at that time. She reports that she has had bleeding off and on since then. However, about 45 mins PTA she started bleeding heavily, and has passed several clementine sized clots.   Vaginal Bleeding  The patient's primary symptoms include pelvic pain and vaginal bleeding. This is a new problem. The current episode started today. The problem occurs constantly. The problem has been gradually improving. The pain is moderate. The problem affects both sides. She is pregnant. Pertinent negatives include no chills, fever, nausea or vomiting. The vaginal discharge was bloody. The vaginal bleeding is heavier than menses. She has been passing clots (about the size of a clementine ). She has not been passing tissue. Nothing aggravates the symptoms.      Past Medical History:  Diagnosis Date  . Anxiety   . Back pain, chronic   . Depression   . Spontaneous pneumothorax    2006/2010    Past Surgical History:  Procedure Laterality Date  . CHEST TUBE INSERTION Bilateral 2006/2010    Family History  Problem Relation Age of Onset  . Diabetes Father   . Alcohol abuse Father   . Drug abuse Father   . Alcohol abuse Mother   . Drug abuse Mother   . Drug abuse Sister   . Alcohol abuse Sister   . Drug abuse Brother   . Alcohol abuse Brother   . Alcohol abuse Maternal Grandfather   . Alcohol abuse Paternal Grandfather     Social History   Tobacco Use  . Smoking status: Current Every Day Smoker    Packs/day: 0.50  . Smokeless tobacco: Never Used  Substance Use Topics  . Alcohol use: Yes    Alcohol/week: 12.0 oz    Types: 20 Shots of  liquor per week  . Drug use: Never    Frequency: 40.0 times per week    Types: Benzodiazepines, Oxycodone    Allergies: No Known Allergies  Medications Prior to Admission  Medication Sig Dispense Refill Last Dose  . acamprosate (CAMPRAL) 333 MG tablet Take 2 tablets (666 mg total) by mouth 3 (three) times daily. (Patient not taking: Reported on 04/13/2018) 180 tablet 2 Not Taking at Unknown time  . ARIPiprazole (ABILIFY) 10 MG tablet Take 1 tablet (10 mg total) by mouth daily. 30 tablet 2 Taking  . azithromycin (ZITHROMAX) 250 MG tablet Take 2 tabs PO x 1 dose, then 1 tab PO QD x 4 days (Patient not taking: Reported on 04/13/2018) 6 tablet 0 Not Taking at Unknown time  . benzonatate (TESSALON) 100 MG capsule Take 1-2 capsules (100-200 mg total) by mouth 3 (three) times daily as needed for cough. (Patient not taking: Reported on 04/13/2018) 40 capsule 0 Not Taking at Unknown time  . citalopram (CELEXA) 40 MG tablet Take 1 tablet (40 mg total) by mouth daily. 30 tablet 2 Taking  . gabapentin (NEURONTIN) 300 MG capsule Take 1 capsule (300 mg total) by mouth 4 (four) times daily. 120 capsule 2 Taking  . hydrOXYzine (VISTARIL) 25 MG capsule Take 1 capsule (25 mg total) by mouth every 6 (six)  hours as needed for anxiety. (Patient not taking: Reported on 04/13/2018) 120 capsule 1 Not Taking at Unknown time  . ibuprofen (ADVIL,MOTRIN) 800 MG tablet    Past Month at Unknown time  . Prenatal Vit-Fe Fumarate-FA (PRENATAL VITAMINS) 28-0.8 MG TABS Take 1 tablet by mouth daily.   04/12/2018 at Unknown time  . promethazine-codeine (PHENERGAN WITH CODEINE) 6.25-10 MG/5ML syrup Take 2.5 mLs by mouth every 6 (six) hours as needed for cough.   04/13/2018 at Unknown time  . Pseudoephedrine HCl (SUDAFED 24 HOUR PO) Take 1 tablet by mouth daily as needed (congestion).   04/12/2018 at Unknown time  . traZODone (DESYREL) 50 MG tablet Take 1 tab at bedtime .May repeat dose x 1 if needed (Patient not taking: Reported on  04/13/2018) 60 tablet 2 Not Taking at Unknown time    Review of Systems  Constitutional: Negative for chills and fever.  Gastrointestinal: Negative for nausea and vomiting.  Genitourinary: Positive for pelvic pain and vaginal bleeding.   Physical Exam   Blood pressure (!) 111/54, pulse 77, temperature 98 F (36.7 C), temperature source Oral, resp. rate 16, SpO2 100 %.  Physical Exam  Nursing note and vitals reviewed. Constitutional: She appears well-developed and well-nourished. No distress.  HENT:  Head: Normocephalic.  Cardiovascular: Normal rate.  Respiratory: Effort normal.  GI: Soft. There is no tenderness.  Genitourinary:  Genitourinary Comments:  External: no lesion Vagina: large amount of blood and 2 clots removed from vagina. Once removed bleeding appeared to have stopped. No tissue noted within the clots on examination.  Cervix: pink, smooth, no CMT    Musculoskeletal: Normal range of motion.  Neurological: She is alert.  Skin: Skin is warm and dry.  Psychiatric: She has a normal mood and affect.   Results for orders placed or performed during the hospital encounter of 04/27/18 (from the past 24 hour(s))  CBC     Status: Abnormal   Collection Time: 04/27/18  3:05 PM  Result Value Ref Range   WBC 9.5 4.0 - 10.5 K/uL   RBC 3.52 (L) 3.87 - 5.11 MIL/uL   Hemoglobin 11.0 (L) 12.0 - 15.0 g/dL   HCT 08.6 (L) 57.8 - 46.9 %   MCV 89.8 78.0 - 100.0 fL   MCH 31.3 26.0 - 34.0 pg   MCHC 34.8 30.0 - 36.0 g/dL   RDW 62.9 52.8 - 41.3 %   Platelets 322 150 - 400 K/uL   US Ob Transvaginal  Result Date: 04/27/2018 CLINICAL DATA:  Initial evaluation for intermittent bleeding, increased over past our. EXAM: OBSTETRIC <14 WK [place] AND TRANSVAGINAL OB US TECHNIQUE: Both transabdominal and transvaginal ultrasound examinations were performed for complete evaluation of the gestation as well as the maternal uterus, adnexal regions, and pelvic cul-de-sac. Transvaginal technique was  performed to assess early pregnancy. COMPARISON:  Prior ultrasound from 04/13/2018 FINDINGS: Intrauterine gestational sac: Single Yolk sac:  Not visualized. Embryo:  Present Cardiac Activity: Present Heart Rate: 146 bpm CRL: 57.6 mm   12 w   2 d                  [place] EDC: 11/07/2018 Subchorionic hemorrhage: Moderate subchorionic hemorrhage measuring 5.3 x 3.9 x 5.5 cm Maternal uterus/adnexae: Ovaries not well visualized. No acute abnormality within the maternal pelvis or adnexa. No free fluid. IMPRESSION: 1. Single live intrauterine pregnancy, estimated gestational age [redacted] weeks and 2 days by crown-rump length. Normal expected fetal growth as compared to prior ultrasound from 04/13/2018. 2. Moderate  subchorionic hemorrhage, increased in size as compared to previous. Electronically Signed   By: Rise Mu M.D.   On: 04/27/2018 16:06   MAU Course  Procedures  MDM   Assessment and Plan   1. Vaginal bleeding before [redacted] weeks gestation   2. [redacted] weeks gestation of pregnancy   3. Subchorionic hemorrhage of placenta in first trimester, single or unspecified fetus    DC home Comfort measures reviewed  1st/2nd Trimester precautions  Bleeding precautions RX: none  Return to MAU as needed FU with OB as planned  Follow-up Information    Department, [facility]. Schedule an appointment as soon as possible for a visit.   Contact information: [address] [place] [address] [phone]            Thressa Sheller 04/27/2018, 2:49 PM

## 2018-05-08 ENCOUNTER — Ambulatory Visit (HOSPITAL_COMMUNITY): Payer: Self-pay

## 2018-05-08 ENCOUNTER — Encounter (HOSPITAL_COMMUNITY): Payer: Self-pay

## 2018-05-08 ENCOUNTER — Other Ambulatory Visit (HOSPITAL_COMMUNITY): Payer: Self-pay

## 2018-05-12 ENCOUNTER — Encounter (HOSPITAL_COMMUNITY): Payer: Self-pay

## 2018-05-13 ENCOUNTER — Ambulatory Visit (HOSPITAL_COMMUNITY)
Admission: RE | Admit: 2018-05-13 | Discharge: 2018-05-13 | Disposition: A | Discharge status: Home / Self Care | Payer: Medicaid Other | Source: Ambulatory Visit | Attending: Nurse Practitioner | Admitting: Nurse Practitioner

## 2018-05-13 ENCOUNTER — Other Ambulatory Visit (HOSPITAL_COMMUNITY): Payer: Self-pay | Admitting: Nurse Practitioner

## 2018-05-13 ENCOUNTER — Encounter (HOSPITAL_COMMUNITY): Payer: Self-pay

## 2018-05-13 DIAGNOSIS — O09522 Supervision of elderly multigravida, second trimester: Secondary | ICD-10-CM | POA: Diagnosis not present

## 2018-05-13 DIAGNOSIS — Z3689 Encounter for other specified antenatal screening: Secondary | ICD-10-CM | POA: Diagnosis present

## 2018-05-13 DIAGNOSIS — Z79891 Long term (current) use of opiate analgesic: Secondary | ICD-10-CM | POA: Insufficient documentation

## 2018-05-13 DIAGNOSIS — O99320 Drug use complicating pregnancy, unspecified trimester: Secondary | ICD-10-CM | POA: Insufficient documentation

## 2018-05-13 DIAGNOSIS — Z3682 Encounter for antenatal screening for nuchal translucency: Secondary | ICD-10-CM | POA: Insufficient documentation

## 2018-05-13 DIAGNOSIS — O4692 Antepartum hemorrhage, unspecified, second trimester: Secondary | ICD-10-CM | POA: Insufficient documentation

## 2018-05-13 DIAGNOSIS — Z3A14 14 weeks gestation of pregnancy: Secondary | ICD-10-CM

## 2018-05-13 DIAGNOSIS — Z315 Encounter for genetic counseling: Secondary | ICD-10-CM | POA: Insufficient documentation

## 2018-05-13 DIAGNOSIS — Z369 Encounter for antenatal screening, unspecified: Secondary | ICD-10-CM

## 2018-05-13 DIAGNOSIS — O99332 Smoking (tobacco) complicating pregnancy, second trimester: Secondary | ICD-10-CM | POA: Diagnosis not present

## 2018-05-13 DIAGNOSIS — O4690 Antepartum hemorrhage, unspecified, unspecified trimester: Secondary | ICD-10-CM

## 2018-05-13 DIAGNOSIS — O26892 Other specified pregnancy related conditions, second trimester: Secondary | ICD-10-CM | POA: Diagnosis not present

## 2018-05-13 DIAGNOSIS — R102 Pelvic and perineal pain: Secondary | ICD-10-CM

## 2018-05-13 DIAGNOSIS — F1120 Opioid dependence, uncomplicated: Secondary | ICD-10-CM | POA: Diagnosis not present

## 2018-05-13 DIAGNOSIS — O09529 Supervision of elderly multigravida, unspecified trimester: Secondary | ICD-10-CM | POA: Insufficient documentation

## 2018-05-13 HISTORY — DX: Other psychoactive substance dependence, uncomplicated: F19.20

## 2018-05-13 HISTORY — DX: Unspecified asthma, uncomplicated: J45.909

## 2018-05-13 HISTORY — DX: Unspecified abnormal cytological findings in specimens from vagina: R87.629

## 2018-05-13 NOTE — Progress Notes (Signed)
Genetic Counseling  High-Risk Gestation Note  Appointment Date:  05/13/2018 Referred By: Alberteen Spindle, NP Date of Birth:  [birth year] Partner:  Joshua Murray   Pregnancy History: [id number] Estimated Date of Delivery: 11/09/18 Estimated Gestational Age: [redacted]w[redacted]d Attending: Darlyn Read, MD   [name] was seen for genetic counseling because of a maternal age of 37 y.o..     In summary:  Discussed AMA and associated risk for fetal aneuploidy  Discussed options for screening  Quad screen- declined  NIPS- elected to pursue Panorama today  Ultrasound- limited ultrasound performed today; detailed anatomy scan available at 18-[redacted] weeks gestation  Discussed diagnostic testing options  Amniocentesis- declined  Reviewed family history concerns  Discussed carrier screening options  CF- drawn at [facility] provider, currently pending  SMA- elected to pursue today [facility])  Hemoglobinopathies- drawn at [facility] provider, currently pending  She was counseled regarding maternal age and the association with risk for chromosome conditions due to nondisjunction with aging of the ova.   We reviewed chromosomes, nondisjunction, and the associated 1 in 11 risk for fetal aneuploidy related to a maternal age of 37 y.o. at [redacted]w[redacted]d gestation.  She was counseled that the risk for aneuploidy decreases as gestational age increases, accounting for those pregnancies which spontaneously abort.  We specifically discussed Down syndrome (trisomy 57), trisomies 40 and 28, and sex chromosome aneuploidies (47,XXX and 47,XXY) including the common features and prognoses of each.   We reviewed available screening options including Quad screen, noninvasive prenatal screening (NIPS)/cell free DNA (cfDNA) screening, and detailed ultrasound.  She was counseled that screening tests are used to modify a patient's a priori risk for aneuploidy, typically based on age. This estimate provides a pregnancy specific risk  assessment. We reviewed the benefits and limitations of each option. Specifically, we discussed the conditions for which each test screens, the detection rates, and false positive rates of each. She was also counseled regarding diagnostic testing via amniocentesis. We reviewed the approximate 1 in 300-500 risk for complications from amniocentesis, including spontaneous pregnancy loss. We discussed the possible results that the tests might provide including: positive, negative, unanticipated, and no result. Finally, they were counseled regarding the cost of each option and potential out of pocket expenses. After consideration of all the options, she elected to proceed with NIPS (Panorama through [facility]).  Those results will be available in 8-10 days.  She declined amniocentesis.   A limited ultrasound was performed today. Gestational age was too far advanced for nuchal translucency assessment. The report will be documented separately.  Detailed ultrasound is available to the patient at ~18+ weeks gestation. No follow-up appointments were made at this time. She understands that screening tests cannot rule out all birth defects or genetic syndromes. The patient was advised of this limitation and states she still does not want additional testing at this time.   Ms. Joy Murray was provided with written information regarding cystic fibrosis (CF), spinal muscular atrophy (SMA) and hemoglobinopathies including the carrier frequency, availability of carrier screening and prenatal diagnosis if indicated.  In addition, we discussed that CF and hemoglobinopathies are routinely screened for as part of the [place] newborn screening panel, and SMA is available on newborn screening as an opt-in under a research protocol in [place] for families who enroll in Early Check. CF carrier screening and hemoglobinopathy evaluation were drawn through her OB provider, per medical records, and are currently pending. After further  discussion, she elected to pursue screening for  SMA drawn today and sent to [facility].  Both family histories were reviewed and found to be noncontributory for birth defects, intellectual disability, and known genetic conditions. Caucasian ancestry was reported for both, and consanguinity was denied. Without further information regarding the provided family history, an accurate genetic risk cannot be calculated. Further genetic counseling is warranted if more information is obtained.  Ms. Joy Murray denied exposure to environmental toxins or chemical agents. She reported use of subutex in pregnancy. She denied the use of alcohol or street drugs. She reported smoking 2-3 cigarettes per day. The associations of smoking in pregnancy were reviewed and cessation encouraged.  She denied significant viral illnesses during the course of her pregnancy.   I counseled Ms. Ingris Pasquarella Murray regarding the above risks and available options.  The approximate face-to-face time with the genetic counselor was 40 minutes.  Quinn Plowman, MS,  Certified Genetic Counselor 05/13/2018

## 2018-05-22 ENCOUNTER — Telehealth (HOSPITAL_COMMUNITY): Payer: Self-pay | Admitting: MS"

## 2018-05-22 NOTE — Telephone Encounter (Signed)
Called Joy Murray to discuss her prenatal cell free DNA test results.  Joy Murray had Panorama testing through [place] [facility].  Testing was offered because of advanced maternal age.   The patient was identified by name and DOB.  We reviewed that these are within normal limits, showing a less than 1 in 10,000 risk for trisomies 21, 18 and 13, and monosomy X (Turner syndrome).  In addition, the risk for triploidy and sex chromosome trisomies (47,XXX and 47,XXY) was also low risk.  We reviewed that this testing identifies > 99% of pregnancies with trisomy 16, trisomy 47, sex chromosome trisomies (47,XXX and 47,XXY), and triploidy. The detection rate for trisomy 18 is 96%.  The detection rate for monosomy X is ~92%.  The false positive rate is <0.1% for all conditions. Testing was also consistent with female fetal sex.  The patient did wish to know fetal sex.  She understands that this testing does not identify all genetic conditions.  All questions were answered to her satisfaction, she was encouraged to call with additional questions or concerns.  Quinn Plowman, MS Certified Genetic Counselor 05/22/2018 9:56 AM

## 2018-05-23 ENCOUNTER — Telehealth (HOSPITAL_COMMUNITY): Payer: Self-pay | Admitting: MS"

## 2018-05-23 NOTE — Telephone Encounter (Signed)
Called [name] regarding results of spinal muscular atrophy (SMA) carrier screening. Patient identified by name and DOB. Carrier screening performed through [facility] was within normal limits identifying >3 SMN1 gene copies. Thus, her risk to be a carrier of SMA is reduced to approximately 1 in 3500. All questions answered to patient's satisfaction.   [name]  05/23/2018 1:43 PM

## 2018-06-02 ENCOUNTER — Other Ambulatory Visit (HOSPITAL_COMMUNITY): Payer: Self-pay

## 2018-06-05 ENCOUNTER — Encounter: Payer: Self-pay | Admitting: *Deleted

## 2018-06-09 ENCOUNTER — Encounter: Payer: Self-pay | Admitting: *Deleted

## 2018-06-09 DIAGNOSIS — O99330 Smoking (tobacco) complicating pregnancy, unspecified trimester: Secondary | ICD-10-CM | POA: Insufficient documentation

## 2018-06-10 ENCOUNTER — Encounter: Payer: Self-pay | Admitting: Family Medicine

## 2018-06-10 ENCOUNTER — Ambulatory Visit (INDEPENDENT_AMBULATORY_CARE_PROVIDER_SITE_OTHER): Payer: Medicaid Other | Admitting: Family Medicine

## 2018-06-10 VITALS — BP 95/46 | HR 63 | Wt 169.9 lb

## 2018-06-10 DIAGNOSIS — Z87898 Personal history of other specified conditions: Secondary | ICD-10-CM | POA: Insufficient documentation

## 2018-06-10 DIAGNOSIS — O0992 Supervision of high risk pregnancy, unspecified, second trimester: Secondary | ICD-10-CM

## 2018-06-10 DIAGNOSIS — O09522 Supervision of elderly multigravida, second trimester: Secondary | ICD-10-CM

## 2018-06-10 DIAGNOSIS — O99330 Smoking (tobacco) complicating pregnancy, unspecified trimester: Secondary | ICD-10-CM

## 2018-06-10 LAB — POCT URINALYSIS DIP (DEVICE)
BILIRUBIN URINE: NEGATIVE
Glucose, UA: NEGATIVE mg/dL
HGB URINE DIPSTICK: NEGATIVE
KETONES UR: NEGATIVE mg/dL
Leukocytes, UA: NEGATIVE
Nitrite: NEGATIVE
PH: 8.5 — AB (ref 5.0–8.0)
Protein, ur: NEGATIVE mg/dL
Specific Gravity, Urine: 1.015 (ref 1.005–1.030)
Urobilinogen, UA: 2 mg/dL — ABNORMAL HIGH (ref 0.0–1.0)
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
## 2018-06-10 NOTE — Patient Instructions (Addendum)
First Trimester of Pregnancy The first trimester of pregnancy is from week 1 until the end of week 13 (months 1 through 3). During this time, your baby will begin to develop inside you. At 6-8 weeks, the eyes and face are formed, and the heartbeat can be seen on ultrasound. At the end of 12 weeks, all the baby's organs are formed. Prenatal care is all the medical care you receive before the birth of your baby. Make sure you get good prenatal care and follow all of your doctor's instructions. Follow these instructions at home: Medicines  Take over-the-counter and prescription medicines only as told by your doctor. Some medicines are safe and some medicines are not safe during pregnancy.  Take a prenatal vitamin that contains at least 600 micrograms (mcg) of folic acid.  If you have trouble pooping (constipation), take medicine that will make your stool soft (stool softener) if your doctor approves. Eating and drinking  Eat regular, healthy meals.  Your doctor will tell you the amount of weight gain that is right for you.  Avoid raw meat and uncooked cheese.  If you feel sick to your stomach (nauseous) or throw up (vomit): ? Eat 4 or 5 small meals a day instead of 3 large meals. ? Try eating a few soda crackers. ? Drink liquids between meals instead of during meals.  To prevent constipation: ? Eat foods that are high in fiber, like fresh fruits and vegetables, whole grains, and beans. ? Drink enough fluids to keep your pee (urine) clear or pale yellow. Activity  Exercise only as told by your doctor. Stop exercising if you have cramps or pain in your lower belly (abdomen) or low back.  Do not exercise if it is too hot, too humid, or if you are in a place of great height (high altitude).  Try to avoid standing for long periods of time. Move your legs often if you must stand in one place for a long time.  Avoid heavy lifting.  Wear low-heeled shoes. Sit and stand up straight.  You  can have sex unless your doctor tells you not to. Relieving pain and discomfort  Wear a good support bra if your breasts are sore.  Take warm water baths (sitz baths) to soothe pain or discomfort caused by hemorrhoids. Use hemorrhoid cream if your doctor says it is okay.  Rest with your legs raised if you have leg cramps or low back pain.  If you have puffy, bulging veins (varicose veins) in your legs: ? Wear support hose or compression stockings as told by your doctor. ? Raise (elevate) your feet for 15 minutes, 3-4 times a day. ? Limit salt in your food. Prenatal care  Schedule your prenatal visits by the twelfth week of pregnancy.  Write down your questions. Take them to your prenatal visits.  Keep all your prenatal visits as told by your doctor. This is important. Safety  Wear your seat belt at all times when driving.  Make a list of emergency phone numbers. The list should include numbers for family, friends, the hospital, and police and fire departments. General instructions  Ask your doctor for a referral to a local prenatal class. Begin classes no later than at the start of month 6 of your pregnancy.  Ask for help if you need counseling or if you need help with nutrition. Your doctor can give you advice or tell you where to go for help.  Do not use hot tubs, steam rooms, or   saunas.  Do not douche or use tampons or scented sanitary pads.  Do not cross your legs for long periods of time.  Avoid all herbs and alcohol. Avoid drugs that are not approved by your doctor.  Do not use any tobacco products, including cigarettes, chewing tobacco, and electronic cigarettes. If you need help quitting, ask your doctor. You may get counseling or other support to help you quit.  Avoid cat litter boxes and soil used by cats. These carry germs that can cause birth defects in the baby and can cause a loss of your baby (miscarriage) or stillbirth.  Visit your dentist. At home, brush  your teeth with a soft toothbrush. Be gentle when you floss. Contact a doctor if:  You are dizzy.  You have mild cramps or pressure in your lower belly.  You have a nagging pain in your belly area.  You continue to feel sick to your stomach, you throw up, or you have watery poop (diarrhea).  You have a bad smelling fluid coming from your vagina.  You have pain when you pee (urinate).  You have increased puffiness (swelling) in your face, hands, legs, or ankles. Get help right away if:  You have a fever.  You are leaking fluid from your vagina.  You have spotting or bleeding from your vagina.  You have very bad belly cramping or pain.  You gain or lose weight rapidly.  You throw up blood. It may look like coffee grounds.  You are around people who have [place] measles, fifth disease, or chickenpox.  You have a very bad headache.  You have shortness of breath.  You have any kind of trauma, such as from a fall or a car accident. Summary  The first trimester of pregnancy is from week 1 until the end of week 13 (months 1 through 3).  To take care of yourself and your unborn baby, you will need to eat healthy meals, take medicines only if your doctor tells you to do so, and do activities that are safe for you and your baby.  Keep all follow-up visits as told by your doctor. This is important as your doctor will have to ensure that your baby is healthy and growing well. This information is not intended to replace advice given to you by your health care provider. Make sure you discuss any questions you have with your health care provider. Document Released: 05/28/2008 Document Revised: 12/18/2016 Document Reviewed: 12/18/2016 Elsevier Interactive Patient Education  2017 [employer]  AREA PEDIATRIC/FAMILY PRACTICE PHYSICIANS  [facility] [address] [place], [place]  [address] Phone - [phone]   Fax - [phone]  [facility] [address] [place], [place] [address] Phone - [phone]   Fax - [phone]  JACK AMOS [address] [place], [place]  [address] Phone - [phone]   Fax - [phone]  [facility] [address] [place], [place]  [address] Phone - [phone]   Fax - [phone]  [facility] [address] [place], [place]  [address] Phone - [phone]   Fax - [phone]  [facility] [address], [address] [place], [place]  [address] Phone - [phone]   Fax - [phone]  [facility] [place] [address], [address] [place], [place]  [address] Phone - [phone]   Fax - [phone]  [facility] [address], [address]  [place], [place]  [address] Phone - [phone]   Fax - [phone]  [facility] [address] [place], [place]  [address] Phone - [phone]   Fax - [phone] [facility] [address] [place], [place]  [address] Phone - [phone]   Fax - [phone]  [facility] [address] [place], [place]  [address] Phone - [phone]   Fax - [phone]  [facility] [address], [address] [place] [place], [place]  [address] Phone - [phone]   Fax - [phone]  [facility] [address] [place], [place]  [address] Phone - [phone]   Fax - [phone]  [facility] [address], [address] [place], [place]  [address] Phone - [phone]  [facility] [address] [place], [place]  [address] Phone - [phone]   Fax - [phone]  [facility] [address], [address] [place], [place]  [address] Phone - [phone]   Fax - [phone]  [facility] [address] [place], [place]  [address] Phone - [phone]   Fax -  [phone]  [facility] [address] [place], [place]  [address] Phone - [phone]   Fax - [phone]   [facility] [address], [address] [place], [place]  [address] Phone - [phone]   Fax - [phone]  [facility] [address], [address] [place], [place]  [address] Phone - [phone]   Fax - [phone]  DAVID RUBIN [address], [address] [place], [place]  [address] Phone - [phone]   Fax - [phone]  [facility] [address] [place], [place]  [address] Phone - [phone]   Fax - [phone]  [facility] [address] [place], [place]  [address] Phone - [phone]   Fax - [phone] [facility] [place] [address] [place], [place]  [address] Phone - [phone]   Fax - [phone]  [facility] [place] [address] [place], [place]  [address] Phone - [phone]   Fax - [phone]  [facility] [place] [address] [place], [place]  [address] Phone - [phone]   Fax - [phone]  [facility] [place] Wyvonne Lenzharlene Flemming MD [address] [place] [address] Phone [phone]  Fax [phone]  Places to have your son circumcised:    [facility] [phone] while you are in hospital  [facility] [phone] $244 by 4 wks  [facility] [phone] $175 by 2 wks  [facility] [phone] $250 by 7 days [facility] [phone] $269 by 4 wks  These prices sometimes change but are roughly what you can expect to pay. Please call and confirm pricing.   Circumcision is considered an elective/non-medically necessary procedure. There are many reasons parents decide to have their sons circumsized. During the  first year of life circumcised males have a reduced risk of urinary tract infections but after this year the rates between circumcised males and uncircumcised males are the same.  It is safe to have your son circumcised outside of the hospital and the places above perform them regularly.   Deciding about Circumcision in Baby Boys  (Up-to-date The Basics)  What is circumcision?  Circumcision is a surgery that removes the skin that covers the tip of the penis, called the "foreskin" Circumcision is usually done when a boy is between 1  and 57 days old. In the [place], circumcision is common. In some other countries, fewer boys are circumcised. Circumcision is a common tradition in some religions.  Should I have my baby boy circumcised?  There is no easy answer. Circumcision has some benefits. But it also has risks. After talking with your doctor, you will have to decide for yourself what is right for your family.  What are the benefits of circumcision?  Circumcised boys seem to have slightly lower rates of: ?Urinary tract infections ?Swelling of the opening at the tip of the penis Circumcised men seem to have slightly lower rates of: ?Urinary tract infections ?Swelling of the opening at the tip of the penis ?Penis cancer ?HIV and other infections that you catch during sex ?Cervical cancer in the women they have sex with Even so, in the [place], the risks of these problems are small - even in boys and men who have not been circumcised. Plus, boys and men who are not circumcised can reduce these extra risks by: ?Cleaning their penis well ?Using condoms during sex  What are the risks of circumcision?  Risks include: ?Bleeding or infection from the surgery ?Damage to or amputation of the penis ?A chance that the doctor will cut off too much or not enough of the foreskin ?A chance that sex won't feel as good later in life Only about 1 out of every 200 circumcisions leads to  problems. There is also a chance that your health insurance won't pay for circumcision.  How is circumcision done in baby boys?  First, the baby gets medicine for pain relief. This might be a cream on the skin or a shot into the base of the penis. Next, the doctor cleans the baby's penis well. Then he or she uses special tools to cut off the foreskin. Finally, the doctor wraps a bandage (called gauze) around the baby's penis. If you have your baby circumcised, his doctor or nurse will give you instructions on how to care for him after the surgery. It is important that you follow those instructions carefully.

## 2018-06-11 ENCOUNTER — Telehealth: Payer: Self-pay | Admitting: General Practice

## 2018-06-11 DIAGNOSIS — O99519 Diseases of the respiratory system complicating pregnancy, unspecified trimester: Principal | ICD-10-CM

## 2018-06-11 DIAGNOSIS — J45909 Unspecified asthma, uncomplicated: Secondary | ICD-10-CM

## 2018-06-11 NOTE — Telephone Encounter (Signed)
Patient called & left message on nurse voicemail line stating she saw Dr Nira Retortegele yesterday and was supposed to be given a Rx for an inhaler & something for acid reflux but she forgot to remind her at the end of her appt. [facility] [address]. Will route to Dr Nira Retortegele.

## 2018-06-16 ENCOUNTER — Ambulatory Visit (HOSPITAL_COMMUNITY): Payer: Medicaid Other

## 2018-06-16 MED ORDER — RANITIDINE HCL 150 MG PO TABS: 150.0000 mg | ORAL_TABLET | Freq: Two times a day (BID) | ORAL | 2 refills | Status: DC

## 2018-06-19 ENCOUNTER — Other Ambulatory Visit: Payer: Self-pay | Admitting: Family Medicine

## 2018-06-19 ENCOUNTER — Ambulatory Visit (HOSPITAL_COMMUNITY)
Admission: RE | Admit: 2018-06-19 | Discharge: 2018-06-19 | Disposition: A | Discharge status: Home / Self Care | Payer: Medicaid Other | Source: Ambulatory Visit | Attending: Family Medicine | Admitting: Family Medicine

## 2018-06-19 ENCOUNTER — Encounter (HOSPITAL_COMMUNITY): Payer: Self-pay

## 2018-06-19 DIAGNOSIS — F1120 Opioid dependence, uncomplicated: Secondary | ICD-10-CM | POA: Insufficient documentation

## 2018-06-19 DIAGNOSIS — O99322 Drug use complicating pregnancy, second trimester: Secondary | ICD-10-CM

## 2018-06-19 DIAGNOSIS — Z363 Encounter for antenatal screening for malformations: Secondary | ICD-10-CM

## 2018-06-19 DIAGNOSIS — O99332 Smoking (tobacco) complicating pregnancy, second trimester: Secondary | ICD-10-CM | POA: Diagnosis not present

## 2018-06-19 DIAGNOSIS — Z3A19 19 weeks gestation of pregnancy: Secondary | ICD-10-CM | POA: Diagnosis not present

## 2018-06-19 DIAGNOSIS — O0992 Supervision of high risk pregnancy, unspecified, second trimester: Secondary | ICD-10-CM

## 2018-06-19 DIAGNOSIS — O09522 Supervision of elderly multigravida, second trimester: Secondary | ICD-10-CM

## 2018-06-19 DIAGNOSIS — Z87898 Personal history of other specified conditions: Secondary | ICD-10-CM

## 2018-06-19 NOTE — Telephone Encounter (Signed)
Called patient & informed her of Rx sent in for acid reflux. Discussed Dr Nira Retortegele would like to talk to her about her inhaler use/history before prescribing. Asked what inhaler she is currently using. Patient states she has been on proair hfa inhaler for years and uses it as needed. Told patient I would let Dr Nira Retortegele know and she may refill it but if not she should definitely talk to the provider about it at her next visit. Patient verbalized understanding & had no questions.

## 2018-06-19 NOTE — Addendum Note (Signed)
Encounter addended by: Levonne HubertStalter, Tannah Dreyfuss M, RDMS, RVT on: 06/19/2018 4:35 PM  Actions taken: Imaging Exam ended

## 2018-06-20 ENCOUNTER — Other Ambulatory Visit (HOSPITAL_COMMUNITY): Payer: Self-pay | Admitting: *Deleted

## 2018-06-20 DIAGNOSIS — Z362 Encounter for other antenatal screening follow-up: Secondary | ICD-10-CM

## 2018-06-20 DIAGNOSIS — O99519 Diseases of the respiratory system complicating pregnancy, unspecified trimester: Principal | ICD-10-CM

## 2018-06-20 DIAGNOSIS — J45909 Unspecified asthma, uncomplicated: Secondary | ICD-10-CM | POA: Insufficient documentation

## 2018-06-20 MED ORDER — ALBUTEROL SULFATE HFA 108 (90 BASE) MCG/ACT IN AERS: 2.0000 | INHALATION_SPRAY | RESPIRATORY_TRACT | 2 refills | Status: AC | PRN

## 2018-06-20 NOTE — Addendum Note (Signed)
Addended by: Frederik PearEGELE, JULIE P on: 06/20/2018 01:21 AM   Modules accepted: Orders

## 2018-06-20 NOTE — Telephone Encounter (Signed)
Proair called into pharmacy for as needed use. Hx of asthma added to problem list.   Kandra NicolasJulie P. Degele, MD OB Fellow

## 2018-07-10 ENCOUNTER — Ambulatory Visit (INDEPENDENT_AMBULATORY_CARE_PROVIDER_SITE_OTHER): Payer: Medicaid Other | Admitting: Obstetrics and Gynecology

## 2018-07-10 VITALS — BP 99/47 | HR 68 | Wt 173.2 lb

## 2018-07-10 DIAGNOSIS — O0993 Supervision of high risk pregnancy, unspecified, third trimester: Secondary | ICD-10-CM
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
## 2018-07-17 ENCOUNTER — Other Ambulatory Visit (HOSPITAL_COMMUNITY): Payer: Self-pay | Admitting: Obstetrics and Gynecology

## 2018-07-17 ENCOUNTER — Encounter (HOSPITAL_COMMUNITY): Payer: Self-pay

## 2018-07-17 ENCOUNTER — Ambulatory Visit (HOSPITAL_COMMUNITY)
Admission: RE | Admit: 2018-07-17 | Discharge: 2018-07-17 | Disposition: A | Discharge status: Home / Self Care | Payer: Medicaid Other | Source: Ambulatory Visit | Attending: Family Medicine | Admitting: Family Medicine

## 2018-07-17 DIAGNOSIS — Z87898 Personal history of other specified conditions: Secondary | ICD-10-CM

## 2018-07-17 DIAGNOSIS — O09522 Supervision of elderly multigravida, second trimester: Secondary | ICD-10-CM

## 2018-07-17 DIAGNOSIS — Z362 Encounter for other antenatal screening follow-up: Secondary | ICD-10-CM | POA: Diagnosis not present

## 2018-07-17 DIAGNOSIS — O09523 Supervision of elderly multigravida, third trimester: Secondary | ICD-10-CM

## 2018-07-17 DIAGNOSIS — Z363 Encounter for antenatal screening for malformations: Secondary | ICD-10-CM | POA: Insufficient documentation

## 2018-07-17 DIAGNOSIS — F1920 Other psychoactive substance dependence, uncomplicated: Secondary | ICD-10-CM | POA: Diagnosis not present

## 2018-07-17 DIAGNOSIS — Z79891 Long term (current) use of opiate analgesic: Secondary | ICD-10-CM | POA: Insufficient documentation

## 2018-07-17 DIAGNOSIS — O283 Abnormal ultrasonic finding on antenatal screening of mother: Secondary | ICD-10-CM | POA: Diagnosis not present

## 2018-07-17 DIAGNOSIS — O99322 Drug use complicating pregnancy, second trimester: Secondary | ICD-10-CM

## 2018-07-17 DIAGNOSIS — F17210 Nicotine dependence, cigarettes, uncomplicated: Secondary | ICD-10-CM | POA: Insufficient documentation

## 2018-07-17 DIAGNOSIS — O99519 Diseases of the respiratory system complicating pregnancy, unspecified trimester: Secondary | ICD-10-CM

## 2018-07-17 DIAGNOSIS — O0992 Supervision of high risk pregnancy, unspecified, second trimester: Secondary | ICD-10-CM

## 2018-07-17 DIAGNOSIS — Z3A23 23 weeks gestation of pregnancy: Secondary | ICD-10-CM | POA: Diagnosis present

## 2018-07-17 DIAGNOSIS — J45909 Unspecified asthma, uncomplicated: Secondary | ICD-10-CM

## 2018-07-17 DIAGNOSIS — O99332 Smoking (tobacco) complicating pregnancy, second trimester: Secondary | ICD-10-CM | POA: Insufficient documentation

## 2018-07-17 DIAGNOSIS — F1120 Opioid dependence, uncomplicated: Secondary | ICD-10-CM | POA: Diagnosis not present

## 2018-07-18 ENCOUNTER — Other Ambulatory Visit (HOSPITAL_COMMUNITY): Payer: Self-pay | Admitting: *Deleted

## 2018-07-18 DIAGNOSIS — Z362 Encounter for other antenatal screening follow-up: Secondary | ICD-10-CM

## 2018-07-23 ENCOUNTER — Telehealth: Payer: Self-pay | Admitting: *Deleted

## 2018-07-23 DIAGNOSIS — O0993 Supervision of high risk pregnancy, unspecified, third trimester: Secondary | ICD-10-CM

## 2018-07-23 DIAGNOSIS — O09522 Supervision of elderly multigravida, second trimester: Secondary | ICD-10-CM

## 2018-07-23 MED ORDER — PRENATAL 27-1 MG PO TABS: 1.0000 | ORAL_TABLET | Freq: Every day | ORAL | 9 refills | Status: AC

## 2018-07-23 NOTE — Telephone Encounter (Signed)
Joy Murray called this am and left a message she is a patient here and has seen several doctors - [facility] [address]

## 2018-07-25 NOTE — Telephone Encounter (Signed)
Rx send to pharmacy as requested. Called Misty StanleyLisa and left a message we sent rx as requested . Call us if questions.

## 2018-07-29 ENCOUNTER — Ambulatory Visit (INDEPENDENT_AMBULATORY_CARE_PROVIDER_SITE_OTHER): Payer: Medicaid Other | Admitting: Obstetrics & Gynecology

## 2018-07-29 VITALS — BP 105/46 | Wt 175.0 lb

## 2018-07-29 DIAGNOSIS — O99320 Drug use complicating pregnancy, unspecified trimester: Secondary | ICD-10-CM

## 2018-07-29 DIAGNOSIS — O0992 Supervision of high risk pregnancy, unspecified, second trimester: Secondary | ICD-10-CM

## 2018-07-29 DIAGNOSIS — F1120 Opioid dependence, uncomplicated: Secondary | ICD-10-CM
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
## 2018-07-29 NOTE — Patient Instructions (Signed)
TDaP Vaccine Pregnancy Get the Whooping Cough Vaccine While You Are Pregnant (CDC)  It is important for women to get the whooping cough vaccine in the third trimester of each pregnancy. Vaccines are the best way to prevent this disease. There are 2 different whooping cough vaccines. Both vaccines combine protection against whooping cough, tetanus and diphtheria, but they are for different age groups: Tdap: for everyone 11 years or older, including pregnant women  DTaP: for children 2 months through 6 years of age  You need the whooping cough vaccine during each of your pregnancies The recommended time to get the shot is during your 27th through 36th week of pregnancy, preferably during the earlier part of this time period. The Centers for Disease Control and Prevention (CDC) recommends that pregnant women receive the whooping cough vaccine for adolescents and adults (called Tdap vaccine) during the third trimester of each pregnancy. The recommended time to get the shot is during your 27th through 36th week of pregnancy, preferably during the earlier part of this time period. This replaces the original recommendation that pregnant women get the vaccine only if they had not previously received it. The American College of Obstetricians and Gynecologists and the American College of Nurse-Midwives support this recommendation.  You should get the whooping cough vaccine while pregnant to pass protection to your baby frame support disabled and/or not supported in this browser  Learn why Joy Murray decided to get the whooping cough vaccine in her 3rd trimester of pregnancy and how her baby girl was born with some protection against the disease. Also available on YouTube. After receiving the whooping cough vaccine, your body will create protective antibodies (proteins produced by the body to fight off diseases) and pass some of them to your baby before birth. These antibodies provide your baby some short-term  protection against whooping cough in early life. These antibodies can also protect your baby from some of the more serious complications that come along with whooping cough. Your protective antibodies are at their highest about 2 weeks after getting the vaccine, but it takes time to pass them to your baby. So the preferred time to get the whooping cough vaccine is early in your third trimester. The amount of whooping cough antibodies in your body decreases over time. That is why CDC recommends you get a whooping cough vaccine during each pregnancy. Doing so allows each of your babies to get the greatest number of protective antibodies from you. This means each of your babies will get the best protection possible against this disease.  Getting the whooping cough vaccine while pregnant is better than getting the vaccine after you give birth Whooping cough vaccination during pregnancy is ideal so your baby will have short-term protection as soon as he is born. This early protection is important because your baby will not start getting his whooping cough vaccines until he is 2 months old. These first few months of life are when your baby is at greatest risk for catching whooping cough. This is also when he's at greatest risk for having severe, potentially life-threating complications from the infection. To avoid that gap in protection, it is best to get a whooping cough vaccine during pregnancy. You will then pass protection to your baby before he is born. To continue protecting your baby, he should get whooping cough vaccines starting at 2 months old. You may never have gotten the Tdap vaccine before and did not get it during this pregnancy. If so, you should make sure   to get the vaccine immediately after you give birth, before leaving the hospital or birthing center. It will take about 2 weeks before your body develops protection (antibodies) in response to the vaccine. Once you have protection from the vaccine,  you are less likely to give whooping cough to your newborn while caring for him. But remember, your baby will still be at risk for catching whooping cough from others. A recent study looked to see how effective Tdap was at preventing whooping cough in babies whose mothers got the vaccine while pregnant or in the hospital after giving birth. The study found that getting Tdap between 27 through 36 weeks of pregnancy is 85% more effective at preventing whooping cough in babies younger than 2 months old. Blood tests cannot tell if you need a whooping cough vaccine There are no blood tests that can tell you if you have enough antibodies in your body to protect yourself or your baby against whooping cough. Even if you have been sick with whooping cough in the past or previously received the vaccine, you still should get the vaccine during each pregnancy. Breastfeeding may pass some protective antibodies onto your baby By breastfeeding, you may pass some antibodies you have made in response to the vaccine to your baby. When you get a whooping cough vaccine during your pregnancy, you will have antibodies in your breast milk that you can share with your baby as soon as your milk comes in. However, your baby will not get protective antibodies immediately if you wait to get the whooping cough vaccine until after delivering your baby. This is because it takes about 2 weeks for your body to create antibodies. Learn more about the health benefits of breastfeeding.  

## 2018-07-29 NOTE — Progress Notes (Signed)
Called Heather to schedule NICU tour, no answer- left message with patient's info, she will call to set up.

## 2018-08-07 ENCOUNTER — Encounter (HOSPITAL_COMMUNITY): Payer: Self-pay

## 2018-08-07 NOTE — Progress Notes (Signed)
NICU NAS Tour scheduled on 08/15/18 at 3:15 p.m.

## 2018-08-14 ENCOUNTER — Ambulatory Visit (HOSPITAL_COMMUNITY): Payer: Self-pay

## 2018-08-15 ENCOUNTER — Ambulatory Visit (HOSPITAL_COMMUNITY)
Admission: RE | Admit: 2018-08-15 | Discharge: 2018-08-15 | Disposition: A | Discharge status: Home / Self Care | Payer: Medicaid Other | Source: Ambulatory Visit | Attending: Family Medicine | Admitting: Family Medicine

## 2018-08-15 ENCOUNTER — Encounter (HOSPITAL_COMMUNITY): Payer: Self-pay

## 2018-08-15 DIAGNOSIS — O99322 Drug use complicating pregnancy, second trimester: Secondary | ICD-10-CM | POA: Insufficient documentation

## 2018-08-15 DIAGNOSIS — O09522 Supervision of elderly multigravida, second trimester: Secondary | ICD-10-CM | POA: Diagnosis present

## 2018-08-15 DIAGNOSIS — O99519 Diseases of the respiratory system complicating pregnancy, unspecified trimester: Secondary | ICD-10-CM

## 2018-08-15 DIAGNOSIS — O99332 Smoking (tobacco) complicating pregnancy, second trimester: Secondary | ICD-10-CM | POA: Insufficient documentation

## 2018-08-15 DIAGNOSIS — Z87898 Personal history of other specified conditions: Secondary | ICD-10-CM

## 2018-08-15 DIAGNOSIS — Z3A27 27 weeks gestation of pregnancy: Secondary | ICD-10-CM | POA: Diagnosis not present

## 2018-08-15 DIAGNOSIS — F1120 Opioid dependence, uncomplicated: Secondary | ICD-10-CM | POA: Diagnosis not present

## 2018-08-15 DIAGNOSIS — O359XX0 Maternal care for (suspected) fetal abnormality and damage, unspecified, not applicable or unspecified: Secondary | ICD-10-CM | POA: Insufficient documentation

## 2018-08-15 DIAGNOSIS — O0992 Supervision of high risk pregnancy, unspecified, second trimester: Secondary | ICD-10-CM

## 2018-08-15 DIAGNOSIS — Z362 Encounter for other antenatal screening follow-up: Secondary | ICD-10-CM

## 2018-08-15 DIAGNOSIS — O99320 Drug use complicating pregnancy, unspecified trimester: Secondary | ICD-10-CM

## 2018-08-15 DIAGNOSIS — J45909 Unspecified asthma, uncomplicated: Secondary | ICD-10-CM

## 2018-08-15 NOTE — Consult Note (Signed)
NAS consult complete.  Eat, sleep,console reviewed.  All questions answered.  Follow-up meeting offered, mother to decide and let OB provider know.  Please call to schedule if desired.

## 2018-08-18 ENCOUNTER — Other Ambulatory Visit (HOSPITAL_COMMUNITY): Payer: Self-pay | Admitting: *Deleted

## 2018-08-18 DIAGNOSIS — O99320 Drug use complicating pregnancy, unspecified trimester: Secondary | ICD-10-CM

## 2018-08-18 DIAGNOSIS — F1120 Opioid dependence, uncomplicated: Secondary | ICD-10-CM

## 2018-08-18 DIAGNOSIS — O09523 Supervision of elderly multigravida, third trimester: Secondary | ICD-10-CM

## 2018-08-19 ENCOUNTER — Other Ambulatory Visit: Payer: Self-pay

## 2018-08-19 DIAGNOSIS — O0992 Supervision of high risk pregnancy, unspecified, second trimester: Secondary | ICD-10-CM

## 2018-08-20 ENCOUNTER — Encounter: Payer: Self-pay | Admitting: Obstetrics and Gynecology

## 2018-08-20 ENCOUNTER — Other Ambulatory Visit: Payer: Medicaid Other

## 2018-08-20 ENCOUNTER — Ambulatory Visit (INDEPENDENT_AMBULATORY_CARE_PROVIDER_SITE_OTHER): Payer: Medicaid Other | Admitting: Obstetrics and Gynecology

## 2018-08-20 ENCOUNTER — Encounter: Payer: Self-pay | Admitting: Family Medicine

## 2018-08-20 VITALS — BP 93/34 | HR 77 | Wt 175.0 lb

## 2018-08-20 DIAGNOSIS — O99513 Diseases of the respiratory system complicating pregnancy, third trimester: Secondary | ICD-10-CM | POA: Diagnosis not present

## 2018-08-20 DIAGNOSIS — Z23 Encounter for immunization: Secondary | ICD-10-CM | POA: Diagnosis not present

## 2018-08-20 DIAGNOSIS — O99519 Diseases of the respiratory system complicating pregnancy, unspecified trimester: Secondary | ICD-10-CM

## 2018-08-20 DIAGNOSIS — O359XX0 Maternal care for (suspected) fetal abnormality and damage, unspecified, not applicable or unspecified: Secondary | ICD-10-CM

## 2018-08-20 DIAGNOSIS — F1120 Opioid dependence, uncomplicated: Secondary | ICD-10-CM

## 2018-08-20 DIAGNOSIS — J45909 Unspecified asthma, uncomplicated: Secondary | ICD-10-CM | POA: Diagnosis not present

## 2018-08-20 DIAGNOSIS — O0992 Supervision of high risk pregnancy, unspecified, second trimester: Secondary | ICD-10-CM

## 2018-08-20 DIAGNOSIS — O99323 Drug use complicating pregnancy, third trimester: Secondary | ICD-10-CM | POA: Diagnosis not present

## 2018-08-20 DIAGNOSIS — O99320 Drug use complicating pregnancy, unspecified trimester: Secondary | ICD-10-CM

## 2018-08-20 DIAGNOSIS — O353XX0 Maternal care for (suspected) damage to fetus from viral disease in mother, not applicable or unspecified: Secondary | ICD-10-CM

## 2018-08-20 DIAGNOSIS — Z87898 Personal history of other specified conditions: Secondary | ICD-10-CM

## 2018-08-20 NOTE — Patient Instructions (Signed)
Take Colace (docusate sodium) 100 mg by mouth daily.
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
## 2018-08-21 LAB — CBC
Hematocrit: 35.2 % (ref 34.0–46.6)
Hemoglobin: 11.8 g/dL (ref 11.1–15.9)
MCH: 30.5 pg (ref 26.6–33.0)
MCHC: 33.5 g/dL (ref 31.5–35.7)
MCV: 91 fL (ref 79–97)
PLATELETS: 330 10*3/uL (ref 150–450)
RBC: 3.87 x10E6/uL (ref 3.77–5.28)
RDW: 13.8 % (ref 12.3–15.4)
WBC: 12.9 10*3/uL — ABNORMAL HIGH (ref 3.4–10.8)

## 2018-08-21 LAB — GLUCOSE TOLERANCE, 2 HOURS W/ 1HR
GLUCOSE, 2 HOUR: 126 mg/dL (ref 65–152)
GLUCOSE, FASTING: 80 mg/dL (ref 65–91)
Glucose, 1 hour: 170 mg/dL (ref 65–179)

## 2018-08-21 LAB — RPR: RPR Ser Ql: NONREACTIVE

## 2018-08-21 LAB — HIV ANTIBODY (ROUTINE TESTING W REFLEX): HIV Screen 4th Generation wRfx: NONREACTIVE

## 2018-09-04 ENCOUNTER — Ambulatory Visit (INDEPENDENT_AMBULATORY_CARE_PROVIDER_SITE_OTHER): Payer: Medicaid Other | Admitting: Obstetrics & Gynecology

## 2018-09-04 ENCOUNTER — Encounter: Payer: Self-pay | Admitting: Obstetrics & Gynecology

## 2018-09-04 VITALS — BP 116/45 | HR 90 | Wt 180.0 lb

## 2018-09-04 DIAGNOSIS — O0993 Supervision of high risk pregnancy, unspecified, third trimester: Secondary | ICD-10-CM

## 2018-09-04 DIAGNOSIS — Z23 Encounter for immunization: Secondary | ICD-10-CM | POA: Diagnosis not present

## 2018-09-04 DIAGNOSIS — J45909 Unspecified asthma, uncomplicated: Secondary | ICD-10-CM | POA: Diagnosis not present

## 2018-09-04 DIAGNOSIS — O99513 Diseases of the respiratory system complicating pregnancy, third trimester: Secondary | ICD-10-CM

## 2018-09-04 DIAGNOSIS — O99519 Diseases of the respiratory system complicating pregnancy, unspecified trimester: Secondary | ICD-10-CM

## 2018-09-04 DIAGNOSIS — O359XX0 Maternal care for (suspected) fetal abnormality and damage, unspecified, not applicable or unspecified: Secondary | ICD-10-CM | POA: Diagnosis not present

## 2018-09-04 DIAGNOSIS — Z87898 Personal history of other specified conditions: Secondary | ICD-10-CM

## 2018-09-04 DIAGNOSIS — O0992 Supervision of high risk pregnancy, unspecified, second trimester: Secondary | ICD-10-CM

## 2018-09-04 MED ORDER — BECLOMETHASONE DIPROPIONATE 40 MCG/ACT IN AERS: 1.0000 | INHALATION_SPRAY | Freq: Two times a day (BID) | RESPIRATORY_TRACT | Status: DC
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
## 2018-09-04 NOTE — Patient Instructions (Signed)

## 2018-09-16 ENCOUNTER — Other Ambulatory Visit (HOSPITAL_COMMUNITY): Payer: Self-pay | Admitting: Maternal and Fetal Medicine

## 2018-09-16 ENCOUNTER — Encounter (HOSPITAL_COMMUNITY): Payer: Self-pay

## 2018-09-16 ENCOUNTER — Ambulatory Visit (HOSPITAL_COMMUNITY)
Admission: RE | Admit: 2018-09-16 | Discharge: 2018-09-16 | Disposition: A | Discharge status: Home / Self Care | Payer: Medicaid Other | Source: Ambulatory Visit | Attending: Family Medicine | Admitting: Family Medicine

## 2018-09-16 DIAGNOSIS — O99320 Drug use complicating pregnancy, unspecified trimester: Secondary | ICD-10-CM

## 2018-09-16 DIAGNOSIS — O09522 Supervision of elderly multigravida, second trimester: Secondary | ICD-10-CM

## 2018-09-16 DIAGNOSIS — O09523 Supervision of elderly multigravida, third trimester: Secondary | ICD-10-CM | POA: Insufficient documentation

## 2018-09-16 DIAGNOSIS — O99323 Drug use complicating pregnancy, third trimester: Secondary | ICD-10-CM

## 2018-09-16 DIAGNOSIS — O358XX0 Maternal care for other (suspected) fetal abnormality and damage, not applicable or unspecified: Secondary | ICD-10-CM | POA: Diagnosis not present

## 2018-09-16 DIAGNOSIS — F1120 Opioid dependence, uncomplicated: Secondary | ICD-10-CM | POA: Diagnosis not present

## 2018-09-16 DIAGNOSIS — O99332 Smoking (tobacco) complicating pregnancy, second trimester: Secondary | ICD-10-CM | POA: Diagnosis not present

## 2018-09-16 DIAGNOSIS — O99519 Diseases of the respiratory system complicating pregnancy, unspecified trimester: Secondary | ICD-10-CM

## 2018-09-16 DIAGNOSIS — O0992 Supervision of high risk pregnancy, unspecified, second trimester: Secondary | ICD-10-CM

## 2018-09-16 DIAGNOSIS — O99333 Smoking (tobacco) complicating pregnancy, third trimester: Secondary | ICD-10-CM | POA: Insufficient documentation

## 2018-09-16 DIAGNOSIS — Z3A32 32 weeks gestation of pregnancy: Secondary | ICD-10-CM | POA: Insufficient documentation

## 2018-09-16 DIAGNOSIS — J45909 Unspecified asthma, uncomplicated: Secondary | ICD-10-CM

## 2018-09-16 DIAGNOSIS — O359XX0 Maternal care for (suspected) fetal abnormality and damage, unspecified, not applicable or unspecified: Secondary | ICD-10-CM

## 2018-09-16 DIAGNOSIS — Z87898 Personal history of other specified conditions: Secondary | ICD-10-CM

## 2018-09-17 ENCOUNTER — Ambulatory Visit (INDEPENDENT_AMBULATORY_CARE_PROVIDER_SITE_OTHER): Payer: Medicaid Other | Admitting: *Deleted

## 2018-09-17 ENCOUNTER — Other Ambulatory Visit (HOSPITAL_COMMUNITY): Payer: Self-pay | Admitting: *Deleted

## 2018-09-17 ENCOUNTER — Ambulatory Visit (INDEPENDENT_AMBULATORY_CARE_PROVIDER_SITE_OTHER): Payer: Medicaid Other | Admitting: Family Medicine

## 2018-09-17 VITALS — BP 110/47 | HR 85 | Wt 180.5 lb

## 2018-09-17 DIAGNOSIS — K649 Unspecified hemorrhoids: Secondary | ICD-10-CM

## 2018-09-17 DIAGNOSIS — O359XX0 Maternal care for (suspected) fetal abnormality and damage, unspecified, not applicable or unspecified: Secondary | ICD-10-CM

## 2018-09-17 DIAGNOSIS — O09523 Supervision of elderly multigravida, third trimester: Secondary | ICD-10-CM

## 2018-09-17 DIAGNOSIS — F419 Anxiety disorder, unspecified: Secondary | ICD-10-CM

## 2018-09-17 DIAGNOSIS — J45909 Unspecified asthma, uncomplicated: Secondary | ICD-10-CM

## 2018-09-17 DIAGNOSIS — F329 Major depressive disorder, single episode, unspecified: Secondary | ICD-10-CM

## 2018-09-17 DIAGNOSIS — O4100X0 Oligohydramnios, unspecified trimester, not applicable or unspecified: Secondary | ICD-10-CM | POA: Insufficient documentation

## 2018-09-17 DIAGNOSIS — F32A Depression, unspecified: Secondary | ICD-10-CM

## 2018-09-17 DIAGNOSIS — Z87898 Personal history of other specified conditions: Secondary | ICD-10-CM

## 2018-09-17 DIAGNOSIS — O35AXX0 Maternal care for other (suspected) fetal abnormality and damage, fetal facial anomalies, not applicable or unspecified: Secondary | ICD-10-CM

## 2018-09-17 DIAGNOSIS — K5900 Constipation, unspecified: Secondary | ICD-10-CM

## 2018-09-17 DIAGNOSIS — O0993 Supervision of high risk pregnancy, unspecified, third trimester: Secondary | ICD-10-CM

## 2018-09-17 DIAGNOSIS — Z62819 Personal history of unspecified abuse in childhood: Secondary | ICD-10-CM

## 2018-09-17 DIAGNOSIS — O99519 Diseases of the respiratory system complicating pregnancy, unspecified trimester: Secondary | ICD-10-CM

## 2018-09-17 DIAGNOSIS — O4103X0 Oligohydramnios, third trimester, not applicable or unspecified: Secondary | ICD-10-CM

## 2018-09-17 DIAGNOSIS — O0992 Supervision of high risk pregnancy, unspecified, second trimester: Secondary | ICD-10-CM

## 2018-09-17 DIAGNOSIS — G894 Chronic pain syndrome: Secondary | ICD-10-CM

## 2018-09-17 DIAGNOSIS — O358XX0 Maternal care for other (suspected) fetal abnormality and damage, not applicable or unspecified: Secondary | ICD-10-CM

## 2018-09-17 MED ORDER — BECLOMETHASONE DIPROP HFA 40 MCG/ACT IN AERB: 1.0000 | INHALATION_SPRAY | Freq: Two times a day (BID) | RESPIRATORY_TRACT | 0 refills | Status: AC

## 2018-09-17 MED ORDER — POLYETHYLENE GLYCOL 3350 17 G PO PACK: 17.0000 g | PACK | Freq: Every day | ORAL | 3 refills | Status: DC
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
## 2018-09-17 NOTE — Progress Notes (Signed)
Pt had US for growth and BPP yesterday. Weekly BPP scheduled @ MFM.  She states she has developed a hemorrhoid due to constipation - using Preparation H since yesterday. Rx for Miralax e-prescribed per standing order. Pt states the Rx for inhaler (QVAR) was not received by her pharmacy last week - Rx re-sent.

## 2018-09-17 NOTE — Addendum Note (Signed)
Addended by: Jill Side on: 09/17/2018 01:23 PM   Modules accepted: Orders

## 2018-09-18 ENCOUNTER — Encounter: Payer: Self-pay | Admitting: Family Medicine

## 2018-09-18 DIAGNOSIS — F419 Anxiety disorder, unspecified: Secondary | ICD-10-CM

## 2018-09-18 DIAGNOSIS — F329 Major depressive disorder, single episode, unspecified: Secondary | ICD-10-CM | POA: Insufficient documentation

## 2018-09-18 DIAGNOSIS — F32A Depression, unspecified: Secondary | ICD-10-CM | POA: Insufficient documentation

## 2018-09-18 DIAGNOSIS — Z62819 Personal history of unspecified abuse in childhood: Secondary | ICD-10-CM | POA: Insufficient documentation

## 2018-09-23 ENCOUNTER — Ambulatory Visit (HOSPITAL_COMMUNITY)
Admission: RE | Admit: 2018-09-23 | Discharge: 2018-09-23 | Disposition: A | Discharge status: Home / Self Care | Payer: Medicaid Other | Source: Ambulatory Visit | Attending: Obstetrics and Gynecology | Admitting: Obstetrics and Gynecology

## 2018-09-23 ENCOUNTER — Encounter (HOSPITAL_COMMUNITY): Payer: Self-pay

## 2018-09-23 ENCOUNTER — Ambulatory Visit (INDEPENDENT_AMBULATORY_CARE_PROVIDER_SITE_OTHER): Payer: Medicaid Other | Admitting: *Deleted

## 2018-09-23 VITALS — BP 118/53 | HR 53 | Wt 182.7 lb

## 2018-09-23 DIAGNOSIS — Z3A33 33 weeks gestation of pregnancy: Secondary | ICD-10-CM | POA: Insufficient documentation

## 2018-09-23 DIAGNOSIS — O99323 Drug use complicating pregnancy, third trimester: Secondary | ICD-10-CM | POA: Insufficient documentation

## 2018-09-23 DIAGNOSIS — O4100X0 Oligohydramnios, unspecified trimester, not applicable or unspecified: Secondary | ICD-10-CM | POA: Diagnosis not present

## 2018-09-23 DIAGNOSIS — O09523 Supervision of elderly multigravida, third trimester: Secondary | ICD-10-CM | POA: Insufficient documentation

## 2018-09-23 DIAGNOSIS — O359XX0 Maternal care for (suspected) fetal abnormality and damage, unspecified, not applicable or unspecified: Secondary | ICD-10-CM

## 2018-09-23 DIAGNOSIS — O35AXX0 Maternal care for other (suspected) fetal abnormality and damage, fetal facial anomalies, not applicable or unspecified: Secondary | ICD-10-CM

## 2018-09-23 DIAGNOSIS — O99333 Smoking (tobacco) complicating pregnancy, third trimester: Secondary | ICD-10-CM | POA: Insufficient documentation

## 2018-09-23 DIAGNOSIS — O99332 Smoking (tobacco) complicating pregnancy, second trimester: Secondary | ICD-10-CM

## 2018-09-23 DIAGNOSIS — F1120 Opioid dependence, uncomplicated: Secondary | ICD-10-CM | POA: Diagnosis not present

## 2018-09-23 DIAGNOSIS — O358XX0 Maternal care for other (suspected) fetal abnormality and damage, not applicable or unspecified: Secondary | ICD-10-CM | POA: Diagnosis not present

## 2018-09-23 NOTE — Progress Notes (Signed)
BPP today @ MFM 

## 2018-09-24 ENCOUNTER — Other Ambulatory Visit: Payer: Self-pay

## 2018-09-30 ENCOUNTER — Ambulatory Visit (INDEPENDENT_AMBULATORY_CARE_PROVIDER_SITE_OTHER): Payer: Medicaid Other | Admitting: *Deleted

## 2018-09-30 ENCOUNTER — Encounter (HOSPITAL_COMMUNITY): Payer: Self-pay

## 2018-09-30 ENCOUNTER — Ambulatory Visit (INDEPENDENT_AMBULATORY_CARE_PROVIDER_SITE_OTHER): Payer: Medicaid Other | Admitting: Advanced Practice Midwife

## 2018-09-30 ENCOUNTER — Ambulatory Visit (HOSPITAL_COMMUNITY)
Admission: RE | Admit: 2018-09-30 | Discharge: 2018-09-30 | Disposition: A | Discharge status: Home / Self Care | Payer: Medicaid Other | Source: Ambulatory Visit | Attending: Obstetrics and Gynecology | Admitting: Obstetrics and Gynecology

## 2018-09-30 VITALS — BP 104/44 | HR 75 | Wt 183.0 lb

## 2018-09-30 DIAGNOSIS — O0992 Supervision of high risk pregnancy, unspecified, second trimester: Secondary | ICD-10-CM

## 2018-09-30 DIAGNOSIS — O09522 Supervision of elderly multigravida, second trimester: Secondary | ICD-10-CM

## 2018-09-30 DIAGNOSIS — O359XX0 Maternal care for (suspected) fetal abnormality and damage, unspecified, not applicable or unspecified: Secondary | ICD-10-CM

## 2018-09-30 DIAGNOSIS — O99320 Drug use complicating pregnancy, unspecified trimester: Secondary | ICD-10-CM

## 2018-09-30 DIAGNOSIS — F1920 Other psychoactive substance dependence, uncomplicated: Secondary | ICD-10-CM

## 2018-09-30 DIAGNOSIS — Z3A34 34 weeks gestation of pregnancy: Secondary | ICD-10-CM | POA: Diagnosis not present

## 2018-09-30 DIAGNOSIS — O99332 Smoking (tobacco) complicating pregnancy, second trimester: Secondary | ICD-10-CM

## 2018-09-30 DIAGNOSIS — J45909 Unspecified asthma, uncomplicated: Secondary | ICD-10-CM

## 2018-09-30 DIAGNOSIS — O4103X0 Oligohydramnios, third trimester, not applicable or unspecified: Secondary | ICD-10-CM

## 2018-09-30 DIAGNOSIS — Z87898 Personal history of other specified conditions: Secondary | ICD-10-CM

## 2018-09-30 DIAGNOSIS — F1120 Opioid dependence, uncomplicated: Secondary | ICD-10-CM

## 2018-09-30 DIAGNOSIS — O358XX0 Maternal care for other (suspected) fetal abnormality and damage, not applicable or unspecified: Secondary | ICD-10-CM

## 2018-09-30 DIAGNOSIS — O09523 Supervision of elderly multigravida, third trimester: Secondary | ICD-10-CM

## 2018-09-30 DIAGNOSIS — O4100X0 Oligohydramnios, unspecified trimester, not applicable or unspecified: Secondary | ICD-10-CM

## 2018-09-30 DIAGNOSIS — O99519 Diseases of the respiratory system complicating pregnancy, unspecified trimester: Secondary | ICD-10-CM

## 2018-09-30 DIAGNOSIS — O35AXX0 Maternal care for other (suspected) fetal abnormality and damage, fetal facial anomalies, not applicable or unspecified: Secondary | ICD-10-CM

## 2018-09-30 LAB — POCT URINALYSIS DIP (DEVICE)
Bilirubin Urine: NEGATIVE
GLUCOSE, UA: NEGATIVE mg/dL
Hgb urine dipstick: NEGATIVE
KETONES UR: NEGATIVE mg/dL
LEUKOCYTES UA: NEGATIVE
Nitrite: NEGATIVE
PROTEIN: NEGATIVE mg/dL
SPECIFIC GRAVITY, URINE: 1.01 (ref 1.005–1.030)
UROBILINOGEN UA: 0.2 mg/dL (ref 0.0–1.0)
pH: 6.5 (ref 5.0–8.0)

## 2018-09-30 NOTE — Progress Notes (Signed)
Pt states her hemorrhoids have not improved much with using the Preparation H.  BPP @ MFM today @ 1600.
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
## 2018-09-30 NOTE — MAU Note (Signed)
Pt here today in [facility] for U/S.  Pt reports some watery discharge for the last 3-4 days requiring her to wear a panty liner.  Pt states she is changing about 3 times a day with clear fluid.  Reports good fetal movement.

## 2018-09-30 NOTE — Patient Instructions (Signed)
Braxton Hicks Contractions °Contractions of the uterus can occur throughout pregnancy, but they are not always a sign that you are in labor. You may have practice contractions called Braxton Hicks contractions. These false labor contractions are sometimes confused with true labor. °What are Braxton Hicks contractions? °Braxton Hicks contractions are tightening movements that occur in the muscles of the uterus before labor. Unlike true labor contractions, these contractions do not result in opening (dilation) and thinning of the cervix. Toward the end of pregnancy (32-34 weeks), Braxton Hicks contractions can happen more often and may become stronger. These contractions are sometimes difficult to tell apart from true labor because they can be very uncomfortable. You should not feel embarrassed if you go to the hospital with false labor. °Sometimes, the only way to tell if you are in true labor is for your health care provider to look for changes in the cervix. The health care provider will do a physical exam and may monitor your contractions. If you are not in true labor, the exam should show that your cervix is not dilating and your water has not broken. °If there are other health problems associated with your pregnancy, it is completely safe for you to be sent home with false labor. You may continue to have Braxton Hicks contractions until you go into true labor. °How to tell the difference between true labor and false labor °True labor °· Contractions last 30-70 seconds. °· Contractions become very regular. °· Discomfort is usually felt in the top of the uterus, and it spreads to the lower abdomen and low back. °· Contractions do not go away with walking. °· Contractions usually become more intense and increase in frequency. °· The cervix dilates and gets thinner. °False labor °· Contractions are usually shorter and not as strong as true labor contractions. °· Contractions are usually irregular. °· Contractions  are often felt in the front of the lower abdomen and in the groin. °· Contractions may go away when you walk around or change positions while lying down. °· Contractions get weaker and are shorter-lasting as time goes on. °· The cervix usually does not dilate or become thin. °Follow these instructions at home: °· Take over-the-counter and prescription medicines only as told by your health care provider. °· Keep up with your usual exercises and follow other instructions from your health care provider. °· Eat and drink lightly if you think you are going into labor. °· If Braxton Hicks contractions are making you uncomfortable: °? Change your position from lying down or resting to walking, or change from walking to resting. °? Sit and rest in a tub of warm water. °? Drink enough fluid to keep your urine pale yellow. Dehydration may cause these contractions. °? Do slow and deep breathing several times an hour. °· Keep all follow-up prenatal visits as told by your health care provider. This is important. °Contact a health care provider if: °· You have a fever. °· You have continuous pain in your abdomen. °Get help right away if: °· Your contractions become stronger, more regular, and closer together. °· You have fluid leaking or gushing from your vagina. °· You pass blood-tinged mucus (bloody show). °· You have bleeding from your vagina. °· You have low back pain that you never had before. °· You feel your baby’s head pushing down and causing pelvic pressure. °· Your baby is not moving inside you as much as it used to. °Summary °· Contractions that occur before labor are called Braxton   Hicks contractions, false labor, or practice contractions. °· Braxton Hicks contractions are usually shorter, weaker, farther apart, and less regular than true labor contractions. True labor contractions usually become progressively stronger and regular and they become more frequent. °· Manage discomfort from Braxton Hicks contractions by  changing position, resting in a warm bath, drinking plenty of water, or practicing deep breathing. °This information is not intended to replace advice given to you by your health care provider. Make sure you discuss any questions you have with your health care provider. °Document Released: 04/25/2017 Document Revised: 04/25/2017 Document Reviewed: 04/25/2017 °Elsevier Interactive Patient Education © 2018 [employer] ° °

## 2018-10-07 ENCOUNTER — Other Ambulatory Visit: Payer: Self-pay

## 2018-10-08 ENCOUNTER — Ambulatory Visit: Payer: Self-pay

## 2018-10-08 ENCOUNTER — Ambulatory Visit (INDEPENDENT_AMBULATORY_CARE_PROVIDER_SITE_OTHER): Payer: Medicaid Other | Admitting: Obstetrics and Gynecology

## 2018-10-08 ENCOUNTER — Ambulatory Visit (INDEPENDENT_AMBULATORY_CARE_PROVIDER_SITE_OTHER): Payer: Medicaid Other | Admitting: *Deleted

## 2018-10-08 VITALS — BP 109/50 | HR 69 | Wt 183.3 lb

## 2018-10-08 DIAGNOSIS — J45909 Unspecified asthma, uncomplicated: Secondary | ICD-10-CM

## 2018-10-08 DIAGNOSIS — O4103X0 Oligohydramnios, third trimester, not applicable or unspecified: Secondary | ICD-10-CM

## 2018-10-08 DIAGNOSIS — Z87898 Personal history of other specified conditions: Secondary | ICD-10-CM

## 2018-10-08 DIAGNOSIS — O4100X0 Oligohydramnios, unspecified trimester, not applicable or unspecified: Secondary | ICD-10-CM

## 2018-10-08 DIAGNOSIS — G894 Chronic pain syndrome: Secondary | ICD-10-CM

## 2018-10-08 DIAGNOSIS — O09523 Supervision of elderly multigravida, third trimester: Secondary | ICD-10-CM

## 2018-10-08 DIAGNOSIS — O0992 Supervision of high risk pregnancy, unspecified, second trimester: Secondary | ICD-10-CM

## 2018-10-08 DIAGNOSIS — O99519 Diseases of the respiratory system complicating pregnancy, unspecified trimester: Secondary | ICD-10-CM
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
## 2018-10-08 NOTE — Progress Notes (Signed)
Pt reports nausea daily and vomiting several times/week. She also has frequent heartburn and sx of seasonal allergies. [place] for growth /BPP next week @ [facility]

## 2018-10-08 NOTE — Progress Notes (Signed)

## 2018-10-09 LAB — ANTIBODY SCREEN: Antibody Screen: NEGATIVE

## 2018-10-14 ENCOUNTER — Other Ambulatory Visit: Payer: Self-pay | Admitting: Family Medicine

## 2018-10-14 ENCOUNTER — Other Ambulatory Visit (HOSPITAL_COMMUNITY): Payer: Self-pay | Admitting: *Deleted

## 2018-10-14 ENCOUNTER — Ambulatory Visit (INDEPENDENT_AMBULATORY_CARE_PROVIDER_SITE_OTHER): Payer: Medicaid Other | Admitting: Obstetrics & Gynecology

## 2018-10-14 ENCOUNTER — Other Ambulatory Visit (HOSPITAL_COMMUNITY)
Admission: RE | Admit: 2018-10-14 | Discharge: 2018-10-14 | Disposition: A | Discharge status: Home / Self Care | Payer: Medicaid Other | Source: Ambulatory Visit | Attending: Obstetrics & Gynecology | Admitting: Obstetrics & Gynecology

## 2018-10-14 ENCOUNTER — Encounter (HOSPITAL_COMMUNITY): Payer: Self-pay

## 2018-10-14 ENCOUNTER — Other Ambulatory Visit: Payer: Self-pay

## 2018-10-14 ENCOUNTER — Ambulatory Visit (HOSPITAL_COMMUNITY): Payer: Medicaid Other

## 2018-10-14 ENCOUNTER — Ambulatory Visit (HOSPITAL_COMMUNITY)
Admission: RE | Admit: 2018-10-14 | Discharge: 2018-10-14 | Disposition: A | Discharge status: Home / Self Care | Payer: Medicaid Other | Source: Ambulatory Visit | Attending: Obstetrics and Gynecology | Admitting: Obstetrics and Gynecology

## 2018-10-14 ENCOUNTER — Encounter

## 2018-10-14 VITALS — BP 113/42 | HR 70 | Wt 186.8 lb

## 2018-10-14 DIAGNOSIS — Z3A36 36 weeks gestation of pregnancy: Secondary | ICD-10-CM | POA: Insufficient documentation

## 2018-10-14 DIAGNOSIS — O359XX0 Maternal care for (suspected) fetal abnormality and damage, unspecified, not applicable or unspecified: Secondary | ICD-10-CM | POA: Diagnosis not present

## 2018-10-14 DIAGNOSIS — G894 Chronic pain syndrome: Secondary | ICD-10-CM

## 2018-10-14 DIAGNOSIS — F1010 Alcohol abuse, uncomplicated: Secondary | ICD-10-CM

## 2018-10-14 DIAGNOSIS — F1920 Other psychoactive substance dependence, uncomplicated: Secondary | ICD-10-CM

## 2018-10-14 DIAGNOSIS — O4100X0 Oligohydramnios, unspecified trimester, not applicable or unspecified: Secondary | ICD-10-CM | POA: Diagnosis present

## 2018-10-14 DIAGNOSIS — O99320 Drug use complicating pregnancy, unspecified trimester: Secondary | ICD-10-CM

## 2018-10-14 DIAGNOSIS — O99323 Drug use complicating pregnancy, third trimester: Secondary | ICD-10-CM

## 2018-10-14 DIAGNOSIS — F329 Major depressive disorder, single episode, unspecified: Secondary | ICD-10-CM

## 2018-10-14 DIAGNOSIS — Z87898 Personal history of other specified conditions: Secondary | ICD-10-CM

## 2018-10-14 DIAGNOSIS — O4103X0 Oligohydramnios, third trimester, not applicable or unspecified: Secondary | ICD-10-CM

## 2018-10-14 DIAGNOSIS — O09523 Supervision of elderly multigravida, third trimester: Secondary | ICD-10-CM

## 2018-10-14 DIAGNOSIS — O99519 Diseases of the respiratory system complicating pregnancy, unspecified trimester: Secondary | ICD-10-CM

## 2018-10-14 DIAGNOSIS — O35AXX0 Maternal care for other (suspected) fetal abnormality and damage, fetal facial anomalies, not applicable or unspecified: Secondary | ICD-10-CM

## 2018-10-14 DIAGNOSIS — O0992 Supervision of high risk pregnancy, unspecified, second trimester: Secondary | ICD-10-CM

## 2018-10-14 DIAGNOSIS — F1120 Opioid dependence, uncomplicated: Secondary | ICD-10-CM

## 2018-10-14 DIAGNOSIS — F330 Major depressive disorder, recurrent, mild: Secondary | ICD-10-CM

## 2018-10-14 DIAGNOSIS — O365930 Maternal care for other known or suspected poor fetal growth, third trimester, not applicable or unspecified: Secondary | ICD-10-CM

## 2018-10-14 DIAGNOSIS — O99330 Smoking (tobacco) complicating pregnancy, unspecified trimester: Secondary | ICD-10-CM

## 2018-10-14 DIAGNOSIS — J45909 Unspecified asthma, uncomplicated: Secondary | ICD-10-CM

## 2018-10-14 DIAGNOSIS — Z62819 Personal history of unspecified abuse in childhood: Secondary | ICD-10-CM

## 2018-10-14 DIAGNOSIS — O358XX0 Maternal care for other (suspected) fetal abnormality and damage, not applicable or unspecified: Secondary | ICD-10-CM | POA: Diagnosis not present

## 2018-10-14 DIAGNOSIS — F419 Anxiety disorder, unspecified: Secondary | ICD-10-CM

## 2018-10-14 NOTE — Procedures (Signed)
Joy Murray [birth year] [redacted]w[redacted]d  Fetus A Non-Stress Test Interpretation for 10/14/18  Indication: Subutex use  Fetal Heart Rate A Mode: External Baseline Rate (A): 120 bpm Variability: Moderate Accelerations: 15 x 15 Decelerations: None Multiple birth?: No  Uterine Activity Mode: Palpation, Toco Contraction Frequency (min): None Resting Tone Palpated: Relaxed Resting Time: Adequate  Interpretation (Fetal Testing) Nonstress Test Interpretation: Reactive Comments: EFM tracing reviewed by Dr. Judeth Cornfield
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
## 2018-10-15 LAB — GC/CHLAMYDIA PROBE AMP (~~LOC~~) NOT AT ARMC
CHLAMYDIA, DNA PROBE: NEGATIVE
Neisseria Gonorrhea: NEGATIVE

## 2018-10-17 LAB — CULTURE, BETA STREP (GROUP B ONLY): Strep Gp B Culture: NEGATIVE

## 2018-10-21 ENCOUNTER — Encounter (HOSPITAL_COMMUNITY): Payer: Self-pay | Admitting: *Deleted

## 2018-10-21 ENCOUNTER — Inpatient Hospital Stay (HOSPITAL_COMMUNITY): Payer: Medicaid Other | Admitting: Certified Registered Nurse Anesthetist

## 2018-10-21 ENCOUNTER — Ambulatory Visit (HOSPITAL_BASED_OUTPATIENT_CLINIC_OR_DEPARTMENT_OTHER)
Admission: RE | Admit: 2018-10-21 | Discharge: 2018-10-21 | Disposition: A | Discharge status: Home / Self Care | Payer: Medicaid Other | Source: Ambulatory Visit | Attending: Obstetrics and Gynecology | Admitting: Obstetrics and Gynecology

## 2018-10-21 ENCOUNTER — Encounter (HOSPITAL_COMMUNITY)
Admission: AD | Disposition: A | Discharge status: Home / Self Care | Payer: Self-pay | Source: Home / Self Care | Attending: Family Medicine

## 2018-10-21 ENCOUNTER — Inpatient Hospital Stay (HOSPITAL_COMMUNITY)
Admission: AD | Admit: 2018-10-21 | Discharge: 2018-10-24 | DRG: 787 | Disposition: A | Discharge status: Home / Self Care | Payer: Medicaid Other | Attending: Family Medicine | Admitting: Family Medicine

## 2018-10-21 ENCOUNTER — Ambulatory Visit (HOSPITAL_COMMUNITY)
Admission: RE | Admit: 2018-10-21 | Discharge: 2018-10-21 | Disposition: A | Discharge status: Home / Self Care | Payer: Medicaid Other | Source: Ambulatory Visit | Attending: Obstetrics and Gynecology | Admitting: Obstetrics and Gynecology

## 2018-10-21 ENCOUNTER — Encounter (HOSPITAL_COMMUNITY): Payer: Self-pay

## 2018-10-21 VITALS — BP 104/44 | HR 77 | Wt 188.6 lb

## 2018-10-21 DIAGNOSIS — F1110 Opioid abuse, uncomplicated: Secondary | ICD-10-CM | POA: Diagnosis present

## 2018-10-21 DIAGNOSIS — O99320 Drug use complicating pregnancy, unspecified trimester: Secondary | ICD-10-CM

## 2018-10-21 DIAGNOSIS — O4103X0 Oligohydramnios, third trimester, not applicable or unspecified: Secondary | ICD-10-CM | POA: Diagnosis present

## 2018-10-21 DIAGNOSIS — O99332 Smoking (tobacco) complicating pregnancy, second trimester: Secondary | ICD-10-CM

## 2018-10-21 DIAGNOSIS — Z3A37 37 weeks gestation of pregnancy: Secondary | ICD-10-CM | POA: Insufficient documentation

## 2018-10-21 DIAGNOSIS — O99334 Smoking (tobacco) complicating childbirth: Secondary | ICD-10-CM | POA: Diagnosis present

## 2018-10-21 DIAGNOSIS — O359XX0 Maternal care for (suspected) fetal abnormality and damage, unspecified, not applicable or unspecified: Secondary | ICD-10-CM | POA: Diagnosis not present

## 2018-10-21 DIAGNOSIS — O365930 Maternal care for other known or suspected poor fetal growth, third trimester, not applicable or unspecified: Secondary | ICD-10-CM

## 2018-10-21 DIAGNOSIS — F1120 Opioid dependence, uncomplicated: Secondary | ICD-10-CM

## 2018-10-21 DIAGNOSIS — O4101X1 Oligohydramnios, first trimester, fetus 1: Secondary | ICD-10-CM | POA: Diagnosis present

## 2018-10-21 DIAGNOSIS — F17210 Nicotine dependence, cigarettes, uncomplicated: Secondary | ICD-10-CM | POA: Diagnosis present

## 2018-10-21 DIAGNOSIS — O99519 Diseases of the respiratory system complicating pregnancy, unspecified trimester: Secondary | ICD-10-CM

## 2018-10-21 DIAGNOSIS — D649 Anemia, unspecified: Secondary | ICD-10-CM | POA: Diagnosis present

## 2018-10-21 DIAGNOSIS — O321XX0 Maternal care for breech presentation, not applicable or unspecified: Secondary | ICD-10-CM | POA: Diagnosis present

## 2018-10-21 DIAGNOSIS — O9902 Anemia complicating childbirth: Secondary | ICD-10-CM | POA: Diagnosis present

## 2018-10-21 DIAGNOSIS — Z87898 Personal history of other specified conditions: Secondary | ICD-10-CM

## 2018-10-21 DIAGNOSIS — O99324 Drug use complicating childbirth: Secondary | ICD-10-CM | POA: Diagnosis present

## 2018-10-21 DIAGNOSIS — O0992 Supervision of high risk pregnancy, unspecified, second trimester: Secondary | ICD-10-CM

## 2018-10-21 DIAGNOSIS — O09523 Supervision of elderly multigravida, third trimester: Secondary | ICD-10-CM

## 2018-10-21 DIAGNOSIS — F1190 Opioid use, unspecified, uncomplicated: Secondary | ICD-10-CM | POA: Diagnosis not present

## 2018-10-21 DIAGNOSIS — J45909 Unspecified asthma, uncomplicated: Secondary | ICD-10-CM

## 2018-10-21 LAB — TYPE AND SCREEN
ABO/RH(D): B POS
Antibody Screen: NEGATIVE

## 2018-10-21 LAB — CBC
HCT: 32.3 % — ABNORMAL LOW (ref 36.0–46.0)
HEMOGLOBIN: 11.3 g/dL — AB (ref 12.0–15.0)
MCH: 32.2 pg (ref 26.0–34.0)
MCHC: 35 g/dL (ref 30.0–36.0)
MCV: 92 fL (ref 80.0–100.0)
PLATELETS: 301 10*3/uL (ref 150–400)
RBC: 3.51 MIL/uL — AB (ref 3.87–5.11)
RDW: 13.7 % (ref 11.5–15.5)
WBC: 12.7 10*3/uL — ABNORMAL HIGH (ref 4.0–10.5)
nRBC: 0 % (ref 0.0–0.2)

## 2018-10-21 LAB — ABO/RH: ABO/RH(D): B POS

## 2018-10-21 SURGERY — Surgical Case
Anesthesia: Spinal

## 2018-10-21 MED ORDER — FENTANYL CITRATE (PF) 100 MCG/2ML IJ SOLN: 50.0000 ug | INTRAMUSCULAR | Status: DC | PRN

## 2018-10-21 MED ORDER — NALBUPHINE HCL 10 MG/ML IJ SOLN: 5.0000 mg | Freq: Once | INTRAMUSCULAR | Status: DC | PRN

## 2018-10-21 MED ORDER — MEPERIDINE HCL 25 MG/ML IJ SOLN: 6.2500 mg | INTRAMUSCULAR | Status: DC | PRN

## 2018-10-21 MED ORDER — KETOROLAC TROMETHAMINE 30 MG/ML IJ SOLN
INTRAMUSCULAR | Status: AC
  Filled 2018-10-21: qty 1

## 2018-10-21 MED ORDER — MORPHINE SULFATE (PF) 0.5 MG/ML IJ SOLN
INTRAMUSCULAR | Status: AC
  Filled 2018-10-21: qty 10

## 2018-10-21 MED ORDER — BUPIVACAINE IN DEXTROSE 0.75-8.25 % IT SOLN
INTRATHECAL | Status: DC | PRN
  Administered 2018-10-21: 1.7 mL via INTRATHECAL

## 2018-10-21 MED ORDER — FENTANYL CITRATE (PF) 100 MCG/2ML IJ SOLN
INTRAMUSCULAR | Status: AC
  Filled 2018-10-21: qty 2

## 2018-10-21 MED ORDER — LACTATED RINGERS IV SOLN: 500.0000 mL | INTRAVENOUS | Status: DC | PRN

## 2018-10-21 MED ORDER — METOCLOPRAMIDE HCL 5 MG/ML IJ SOLN
INTRAMUSCULAR | Status: AC
  Filled 2018-10-21: qty 2

## 2018-10-21 MED ORDER — SOD CITRATE-CITRIC ACID 500-334 MG/5ML PO SOLN
30.0000 mL | ORAL | Status: DC | PRN
  Administered 2018-10-21: 30 mL via ORAL
  Filled 2018-10-21: qty 15

## 2018-10-21 MED ORDER — CEFAZOLIN SODIUM-DEXTROSE 2-4 GM/100ML-% IV SOLN
2.0000 g | INTRAVENOUS | Status: AC
  Administered 2018-10-21: 2 g via INTRAVENOUS
  Filled 2018-10-21: qty 100

## 2018-10-21 MED ORDER — OXYCODONE-ACETAMINOPHEN 5-325 MG PO TABS: 1.0000 | ORAL_TABLET | ORAL | Status: DC | PRN

## 2018-10-21 MED ORDER — DIPHENHYDRAMINE HCL 50 MG/ML IJ SOLN
INTRAMUSCULAR | Status: DC | PRN
  Administered 2018-10-21: 25 mg via INTRAVENOUS

## 2018-10-21 MED ORDER — FLEET ENEMA 7-19 GM/118ML RE ENEM: 1.0000 | ENEMA | RECTAL | Status: DC | PRN

## 2018-10-21 MED ORDER — LIDOCAINE HCL (PF) 1 % IJ SOLN: 30.0000 mL | INTRAMUSCULAR | Status: DC | PRN

## 2018-10-21 MED ORDER — NALBUPHINE HCL 10 MG/ML IJ SOLN: 5.0000 mg | INTRAMUSCULAR | Status: DC | PRN

## 2018-10-21 MED ORDER — OXYTOCIN 10 UNIT/ML IJ SOLN
INTRAVENOUS | Status: DC | PRN
  Administered 2018-10-21: 40 [IU] via INTRAVENOUS

## 2018-10-21 MED ORDER — FENTANYL CITRATE (PF) 100 MCG/2ML IJ SOLN
INTRAMUSCULAR | Status: DC | PRN
  Administered 2018-10-21: 15 ug via INTRATHECAL
  Administered 2018-10-21: 25 ug via INTRAVENOUS

## 2018-10-21 MED ORDER — ONDANSETRON HCL 4 MG/2ML IJ SOLN
INTRAMUSCULAR | Status: DC | PRN
  Administered 2018-10-21: 4 mg via INTRAVENOUS

## 2018-10-21 MED ORDER — KETOROLAC TROMETHAMINE 30 MG/ML IJ SOLN
30.0000 mg | Freq: Four times a day (QID) | INTRAMUSCULAR | Status: AC | PRN
  Administered 2018-10-22: 30 mg via INTRAVENOUS
  Filled 2018-10-21: qty 1

## 2018-10-21 MED ORDER — DEXAMETHASONE SODIUM PHOSPHATE 4 MG/ML IJ SOLN
INTRAMUSCULAR | Status: AC
  Filled 2018-10-21: qty 1

## 2018-10-21 MED ORDER — ONDANSETRON HCL 4 MG/2ML IJ SOLN: 4.0000 mg | Freq: Three times a day (TID) | INTRAMUSCULAR | Status: DC | PRN

## 2018-10-21 MED ORDER — METOCLOPRAMIDE HCL 5 MG/ML IJ SOLN
INTRAMUSCULAR | Status: DC | PRN
  Administered 2018-10-21: 5 mg via INTRAVENOUS

## 2018-10-21 MED ORDER — ONDANSETRON HCL 4 MG/2ML IJ SOLN: 4.0000 mg | Freq: Four times a day (QID) | INTRAMUSCULAR | Status: DC | PRN

## 2018-10-21 MED ORDER — ONDANSETRON HCL 4 MG/2ML IJ SOLN
INTRAMUSCULAR | Status: AC
  Filled 2018-10-21: qty 2

## 2018-10-21 MED ORDER — DIPHENHYDRAMINE HCL 25 MG PO CAPS
25.0000 mg | ORAL_CAPSULE | ORAL | Status: DC | PRN
  Filled 2018-10-21: qty 1

## 2018-10-21 MED ORDER — BUPRENORPHINE HCL 8 MG SL SUBL
8.0000 mg | SUBLINGUAL_TABLET | SUBLINGUAL | Status: DC
  Administered 2018-10-21 – 2018-10-22 (×3): 8 mg via SUBLINGUAL
  Filled 2018-10-21 (×3): qty 1

## 2018-10-21 MED ORDER — SODIUM CHLORIDE 0.9% FLUSH: 3.0000 mL | INTRAVENOUS | Status: DC | PRN

## 2018-10-21 MED ORDER — EPHEDRINE 5 MG/ML INJ
INTRAVENOUS | Status: AC
  Filled 2018-10-21: qty 10

## 2018-10-21 MED ORDER — OXYTOCIN BOLUS FROM INFUSION: 500.0000 mL | Freq: Once | INTRAVENOUS | Status: DC

## 2018-10-21 MED ORDER — OXYTOCIN 40 UNITS IN LACTATED RINGERS INFUSION - SIMPLE MED: 2.5000 [IU]/h | INTRAVENOUS | Status: DC

## 2018-10-21 MED ORDER — SCOPOLAMINE 1 MG/3DAYS TD PT72
MEDICATED_PATCH | TRANSDERMAL | Status: AC
  Filled 2018-10-21: qty 1

## 2018-10-21 MED ORDER — FENTANYL CITRATE (PF) 100 MCG/2ML IJ SOLN
25.0000 ug | INTRAMUSCULAR | Status: DC | PRN
  Administered 2018-10-21 (×2): 50 ug via INTRAVENOUS

## 2018-10-21 MED ORDER — ACETAMINOPHEN 325 MG PO TABS: 650.0000 mg | ORAL_TABLET | ORAL | Status: DC | PRN

## 2018-10-21 MED ORDER — DEXAMETHASONE SODIUM PHOSPHATE 4 MG/ML IJ SOLN
INTRAMUSCULAR | Status: DC | PRN
  Administered 2018-10-21: 4 mg via INTRAVENOUS

## 2018-10-21 MED ORDER — LACTATED RINGERS IV SOLN
INTRAVENOUS | Status: DC | PRN
  Administered 2018-10-21: 21:00:00 via INTRAVENOUS

## 2018-10-21 MED ORDER — NALOXONE HCL 0.4 MG/ML IJ SOLN: 0.4000 mg | INTRAMUSCULAR | Status: DC | PRN

## 2018-10-21 MED ORDER — OXYTOCIN 10 UNIT/ML IJ SOLN
INTRAMUSCULAR | Status: AC
  Filled 2018-10-21: qty 4

## 2018-10-21 MED ORDER — MORPHINE SULFATE (PF) 0.5 MG/ML IJ SOLN
INTRAMUSCULAR | Status: DC | PRN
  Administered 2018-10-21: .15 mg via INTRATHECAL

## 2018-10-21 MED ORDER — OXYCODONE-ACETAMINOPHEN 5-325 MG PO TABS: 2.0000 | ORAL_TABLET | ORAL | Status: DC | PRN

## 2018-10-21 MED ORDER — KETOROLAC TROMETHAMINE 30 MG/ML IJ SOLN
30.0000 mg | Freq: Four times a day (QID) | INTRAMUSCULAR | Status: AC | PRN
  Administered 2018-10-21: 30 mg via INTRAMUSCULAR

## 2018-10-21 MED ORDER — PHENYLEPHRINE 8 MG IN D5W 100 ML (0.08MG/ML) PREMIX OPTIME
INJECTION | INTRAVENOUS | Status: AC
  Filled 2018-10-21: qty 100

## 2018-10-21 MED ORDER — LACTATED RINGERS IV SOLN
INTRAVENOUS | Status: DC
  Administered 2018-10-21 (×3): via INTRAVENOUS

## 2018-10-21 MED ORDER — PHENYLEPHRINE 8 MG IN D5W 100 ML (0.08MG/ML) PREMIX OPTIME
INJECTION | INTRAVENOUS | Status: DC | PRN
  Administered 2018-10-21: 60 ug/min via INTRAVENOUS

## 2018-10-21 MED ORDER — NALOXONE HCL 4 MG/10ML IJ SOLN
1.0000 ug/kg/h | INTRAVENOUS | Status: DC | PRN
  Filled 2018-10-21: qty 5

## 2018-10-21 MED ORDER — DIPHENHYDRAMINE HCL 50 MG/ML IJ SOLN: 12.5000 mg | INTRAMUSCULAR | Status: DC | PRN

## 2018-10-21 MED ORDER — EPHEDRINE SULFATE 50 MG/ML IJ SOLN
INTRAMUSCULAR | Status: DC | PRN
  Administered 2018-10-21: 5 mg via INTRAVENOUS

## 2018-10-21 SURGICAL SUPPLY — 31 items
BENZOIN TINCTURE PRP APPL 2/3 (GAUZE/BANDAGES/DRESSINGS) ×2 IMPLANT
CHLORAPREP W/TINT 26ML (MISCELLANEOUS) ×2 IMPLANT
CLAMP CORD UMBIL (MISCELLANEOUS) IMPLANT
CLOTH BEACON ORANGE TIMEOUT ST (SAFETY) ×2 IMPLANT
DRSG OPSITE POSTOP 4X10 (GAUZE/BANDAGES/DRESSINGS) ×2 IMPLANT
ELECT REM PT RETURN 9FT ADLT (ELECTROSURGICAL) ×2
ELECTRODE REM PT RTRN 9FT ADLT (ELECTROSURGICAL) ×1 IMPLANT
EXTRACTOR VACUUM M CUP 4 TUBE (SUCTIONS) IMPLANT
GLOVE BIOGEL PI IND STRL 7.0 (GLOVE) ×2 IMPLANT
GLOVE BIOGEL PI IND STRL 7.5 (GLOVE) ×2 IMPLANT
GLOVE BIOGEL PI INDICATOR 7.0 (GLOVE) ×2
GLOVE BIOGEL PI INDICATOR 7.5 (GLOVE) ×2
GLOVE ECLIPSE 7.5 STRL STRAW (GLOVE) ×2 IMPLANT
GOWN STRL REUS W/TWL LRG LVL3 (GOWN DISPOSABLE) ×6 IMPLANT
KIT ABG SYR 3ML LUER SLIP (SYRINGE) IMPLANT
NEEDLE HYPO 25X5/8 SAFETYGLIDE (NEEDLE) IMPLANT
NS IRRIG 1000ML POUR BTL (IV SOLUTION) ×2 IMPLANT
PACK C SECTION WH (CUSTOM PROCEDURE TRAY) ×2 IMPLANT
PAD OB MATERNITY 4.3X12.25 (PERSONAL CARE ITEMS) ×2 IMPLANT
PENCIL SMOKE EVAC W/HOLSTER (ELECTROSURGICAL) ×2 IMPLANT
RTRCTR C-SECT PINK 25CM LRG (MISCELLANEOUS) ×2 IMPLANT
SPONGE LAP 18X18 RF (DISPOSABLE) ×6 IMPLANT
STRIP CLOSURE SKIN 1/2X4 (GAUZE/BANDAGES/DRESSINGS) ×2 IMPLANT
SUT VIC AB 0 CTX 36 (SUTURE) ×4
SUT VIC AB 0 CTX36XBRD ANBCTRL (SUTURE) ×4 IMPLANT
SUT VIC AB 2-0 CT1 27 (SUTURE) ×1
SUT VIC AB 2-0 CT1 TAPERPNT 27 (SUTURE) ×1 IMPLANT
SUT VIC AB 4-0 KS 27 (SUTURE) ×2 IMPLANT
TOWEL OR 17X24 6PK STRL BLUE (TOWEL DISPOSABLE) ×2 IMPLANT
TRAY FOLEY W/BAG SLVR 14FR LF (SET/KITS/TRAYS/PACK) ×2 IMPLANT
WATER STERILE IRR 1000ML POUR (IV SOLUTION) ×2 IMPLANT

## 2018-10-21 NOTE — Transfer of Care (Signed)
Immediate Anesthesia Transfer of Care Note  Patient: Joy Murray  Procedure(s) Performed: CESAREAN SECTION (N/A )  Patient Location: PACU  Anesthesia Type:Spinal  Level of Consciousness: awake, alert  and oriented  Airway & Oxygen Therapy: Patient Spontanous Breathing  Post-op Assessment: Report given to RN and Post -op Vital signs reviewed and stable  Post vital signs: Reviewed and stable  Last Vitals:  Vitals Value Taken Time  BP    Temp    Pulse    Resp    SpO2      Last Pain:  Vitals:   10/21/18 2023  TempSrc: Oral  PainSc: 0-No pain         Complications: No apparent anesthesia complications

## 2018-10-21 NOTE — Anesthesia Preprocedure Evaluation (Addendum)
Anesthesia Evaluation  Patient identified by MRN, date of birth, ID band Patient awake    Reviewed: Allergy & Precautions, NPO status , Patient's Chart, lab work & pertinent test results  Airway Mallampati: II  TM Distance: >3 FB Neck ROM: Full    Dental no notable dental hx. (+) Teeth Intact   Pulmonary asthma , Current Smoker,    Pulmonary exam normal breath sounds clear to auscultation       Cardiovascular negative cardio ROS Normal cardiovascular exam Rhythm:Regular Rate:Normal     Neuro/Psych PSYCHIATRIC DISORDERS Anxiety Depression Hx/o PTSDnegative neurological ROS     GI/Hepatic GERD  Medicated,(+)     substance abuse  , Hx/o chronic opioid use and polysubstance abuse Hx/o IBS   Endo/Other  negative endocrine ROS  Renal/GU negative Renal ROS  negative genitourinary   Musculoskeletal  (+) narcotic dependentChronic LBP with HNP L-4-5   Abdominal (+) - obese,   Peds  Hematology  (+) anemia ,   Anesthesia Other Findings   Reproductive/Obstetrics (+) Pregnancy Breech presentation                            Anesthesia Physical Anesthesia Plan  ASA: II  Anesthesia Plan: Spinal   Post-op Pain Management:    Induction:   PONV Risk Score and Plan: 3 and Scopolamine patch - Pre-op, Dexamethasone and Ondansetron  Airway Management Planned: Natural Airway  Additional Equipment:   Intra-op Plan:   Post-operative Plan:   Informed Consent: I have reviewed the patients History and Physical, chart, labs and discussed the procedure including the risks, benefits and alternatives for the proposed anesthesia with the patient or authorized representative who has indicated his/her understanding and acceptance.   Dental advisory given  Plan Discussed with: CRNA and Surgeon  Anesthesia Plan Comments:         Anesthesia Quick Evaluation

## 2018-10-21 NOTE — Anesthesia Procedure Notes (Signed)
Spinal  Patient location during procedure: OR Start time: 10/21/2018 8:52 PM End time: 10/21/2018 8:57 PM Staffing Anesthesiologist: Mal Amabile, MD Performed: anesthesiologist  Preanesthetic Checklist Completed: patient identified, site marked, surgical consent, pre-op evaluation, timeout performed, IV checked, risks and benefits discussed and monitors and equipment checked Spinal Block Patient position: sitting Prep: DuraPrep Patient monitoring: heart rate, cardiac monitor, continuous pulse ox and blood pressure Approach: midline Location: L3-4 Injection technique: single-shot Needle Needle type: Pencan  Needle gauge: 24 G Needle length: 9 cm Needle insertion depth: 6 cm Assessment Sensory level: T4 Additional Notes Patient tolerated procedure well. Adequate sensory level.

## 2018-10-21 NOTE — Op Note (Signed)
Joy Murray PROCEDURE DATE: 10/21/2018  PREOPERATIVE DIAGNOSIS: Intrauterine pregnancy at  [redacted]w[redacted]d weeks gestation; malpresentation: breech  POSTOPERATIVE DIAGNOSIS: The same  PROCEDURE: Primary Low Transverse Cesarean Section  SURGEON:  Dr. Candelaria Celeste  ASSISTANT: Dr Aneta Mins  INDICATIONS: Joy Murray is a 37 y.o. [id number] at [redacted]w[redacted]d scheduled for cesarean section secondary to malpresentation: breech.  The risks of cesarean section discussed with the patient included but were not limited to: bleeding which may require transfusion or reoperation; infection which may require antibiotics; injury to bowel, bladder, ureters or other surrounding organs; injury to the fetus; need for additional procedures including hysterectomy in the event of a life-threatening hemorrhage; placental abnormalities wth subsequent pregnancies, incisional problems, thromboembolic phenomenon and other postoperative/anesthesia complications. The patient concurred with the proposed plan, giving informed written consent for the procedure.    FINDINGS:  Viable female infant in frank breech presentation.  Apgars 9 and 9, weight, 4 pounds and 9.4 ounces.  Clear amniotic fluid.  Intact placenta, three vessel cord.  Normal uterus, fallopian tubes and ovaries bilaterally.  ANESTHESIA:    Spinal INTRAVENOUS FLUIDS:2500 ml ESTIMATED BLOOD LOSS: 249 ml URINE OUTPUT:  200 ml SPECIMENS: Placenta sent to pathology COMPLICATIONS: None immediate  PROCEDURE IN DETAIL:  The patient received intravenous antibiotics and had sequential compression devices applied to her lower extremities while in the preoperative area.  She was then taken to the operating room where spinal anesthesia was administered and was found to be adequate. She was then placed in a dorsal supine position with a leftward tilt, and prepped and draped in a sterile manner.  A foley catheter was placed into her bladder and attached to constant gravity, which drained clear  fluid throughout.  After an adequate timeout was performed, a Pfannenstiel skin incision was made with scalpel and carried through to the underlying layer of fascia. The fascia was incised in the midline and this incision was extended bilaterally using the Mayo scissors. Kocher clamps were applied to the superior aspect of the fascial incision and the underlying rectus muscles were dissected off bluntly. A similar process was carried out on the inferior aspect of the facial incision. The rectus muscles were separated in the midline bluntly and the peritoneum was entered bluntly. An Alexis retractor was placed to aid in visualization of the uterus.  Attention was turned to the lower uterine segment where a transverse hysterotomy was made with a scalpel and extended bilaterally bluntly. The infant was successfully delivered, and cord was clamped and cut and infant was handed over to awaiting neonatology team. Uterine massage was then administered and the placenta delivered intact with three-vessel cord. The uterus was then cleared of clot and debris.  The hysterotomy was closed with 0 Vicryl in a running locked fashion, and an imbricating layer was also placed with a 0 Vicryl. Overall, excellent hemostasis was noted. The abdomen and the pelvis were cleared of all clot and debris and the Jon Gills was removed. Hemostasis was confirmed on all surfaces.  The peritoneum was reapproximated using 0 vicryl running stitches. The muscle was reapproximated using 2-0 vicryl running stitches.The fascia was then closed using 0 Vicryl in a running fashion. The skin was closed with 4-0 vicryl. The patient tolerated the procedure well. Sponge, lap, instrument and needle counts were correct x 2. She was taken to the recovery room in stable condition.    Gwenevere Abbot, MD 10/21/2018 10:54 PM

## 2018-10-21 NOTE — Discharge Summary (Signed)
Postpartum Discharge Summary     Patient Name: Joy Murray DOB: 1981/05/27 MRN: [id number]  Date of admission: 10/21/2018 Delivering Provider: Levie Heritage   Date of discharge: 10/24/2018  Admitting diagnosis: induction Intrauterine pregnancy: [redacted]w[redacted]d     Secondary diagnosis:  Active Problems:   Oligohydramnios antepartum, first trimester, fetus 1  Additional problems: opioid use disorder on subutex, breech presentation, IUGR, abnormal dopplers, ?restless limbs at night     Discharge diagnosis: Term Pregnancy Delivered and SGA                                                                                                Post partum procedures:None  Augmentation: None  Complications: None  Hospital course:  Induction of Labor With Cesarean Section  37 y.o. yo [phone] at [redacted]w[redacted]d was admitted to the hospital 10/21/2018 for induction of labor due to oligohydramnios, IUGR. Patient had a labor course significant for incidental breech presentation noted during labor, leading to cesarean section.  She delivered a Viable infant,10/21/2018. Details of operation can be found in separate operative Note.  Patient had an uncomplicated postpartum course. She is ambulating, tolerating a regular diet, passing flatus, and urinating well.  Patient is discharged home in stable condition on 10/24/18.  Of note, patient is on Subutex, will continue to get this from her provider, SW saw her during hospitalization.   Patient also reported some restless limb situation that occurs at bedtime, prescribed Neurontin for her at night and told her to let us know if symptoms worsen as she may need other evaluation.                                Physical exam  Vitals:   10/23/18 0534 10/23/18 1500 10/23/18 2136 10/24/18 0532  BP: 94/81 112/62 (!) 112/56 (!) 111/54  Pulse: 68 68 70 73  Resp: 18 19 16 16   Temp: 97.9 F (36.6 C) 98 F (36.7 C) 97.7 F (36.5 C) 98.9 F (37.2 C)  TempSrc: Oral Oral Axillary  Oral  SpO2: 100%  100% 98%  Weight:      Height:       General: alert, cooperative and no distress Lochia: appropriate Uterine Fundus: firm Incision: Healing well with no significant drainage, No significant erythema, Dressing is clean, dry, and intact DVT Evaluation: No evidence of DVT seen on physical exam. Negative Homan's sign. No cords or calf tenderness. No significant calf/ankle edema. Labs: Lab Results  Component Value Date   WBC 16.0 (H) 10/22/2018   HGB 10.5 (L) 10/22/2018   HCT 29.9 (L) 10/22/2018   MCV 92.3 10/22/2018   PLT 278 10/22/2018   CMP Latest Ref Rng & Units 04/13/2018  Glucose 65 - 99 mg/dL 80  BUN 6 - 20 mg/dL 8  Creatinine 5.28 - 4.13 mg/dL 2.44  Sodium 010 - 272 mmol/L 137  Potassium 3.5 - 5.1 mmol/L 3.9  Chloride 101 - 111 mmol/L 106  CO2 22 - 32 mmol/L 22  Calcium 8.9 - 10.3 mg/dL 9.2  Discharge instruction: per After Visit Summary and "Baby and Me Booklet".  After visit meds:  Allergies as of 10/24/2018   No Known Allergies     Medication List    TAKE these medications   acetaminophen 500 MG tablet Commonly known as:  TYLENOL Take 2 tablets (1,000 mg total) by mouth every 6 (six) hours as needed for moderate pain, fever or headache (for pain scale < 4).   albuterol 108 (90 Base) MCG/ACT inhaler Commonly known as:  PROVENTIL HFA;VENTOLIN HFA Inhale 2 puffs into the lungs every 4 (four) hours as needed for wheezing or shortness of breath.   beclomethasone 40 MCG/ACT inhaler Commonly known as:  QVAR Inhale 1 puff into the lungs 2 (two) times daily. What changed:  when to take this   buprenorphine 8 MG Subl SL tablet Commonly known as:  SUBUTEX Place 8 mg under the tongue 3 (three) times daily.   docusate sodium 100 MG capsule Commonly known as:  COLACE Take 100 mg by mouth 2 (two) times daily.   ferrous sulfate 325 (65 FE) MG tablet Take 325 mg by mouth at bedtime.   gabapentin 300 MG capsule Commonly known as:   NEURONTIN Take 2 capsules (600 mg total) by mouth at bedtime.   ibuprofen 600 MG tablet Commonly known as:  ADVIL,MOTRIN Take 1 tablet (600 mg total) by mouth every 6 (six) hours as needed for mild pain, moderate pain or cramping.   loratadine 10 MG tablet Commonly known as:  CLARITIN Take 10 mg by mouth daily.   phenylephrine-shark liver oil-mineral oil-petrolatum 0.25-3-14-71.9 % rectal ointment Commonly known as:  PREPARATION H Place 1 application rectally 2 (two) times daily as needed for hemorrhoids.   polyethylene glycol packet Commonly known as:  MIRALAX / GLYCOLAX Take 17 g by mouth daily as needed for mild constipation or moderate constipation.   PRENATAL 27-1 MG Tabs Take 1 tablet by mouth daily.   ranitidine 150 MG tablet Commonly known as:  ZANTAC Take 150 mg by mouth at bedtime.            Discharge Care Instructions  (From admission, onward)         Start     Ordered   10/24/18 0000  Discharge wound care:    Comments:  As per discharge handout and nursing instructions   10/24/18 1124          Diet: routine diet  Activity: Advance as tolerated. Pelvic rest for 6 weeks.    Future Appointments  Date Time Provider Department Center  11/05/2018  4:20 PM [facility] NURSE [facility]  12/05/2018 10:55 AM Adrian Blackwater Rhona Raider, DO [facility]    Newborn Data: Live born female  Birth Weight: 4 lb 9.4 oz (2080 g) APGAR: 9, 9  Newborn Delivery   Birth date/time:  10/21/2018 21:21:00 Delivery type:  C-Section, Low Transverse Trial of labor:  No C-section categorization:  Primary     Baby Feeding: Bottle Disposition:Unsure at time of patient's discharge, possibly rooming in   10/24/2018 Jaynie Collins, MD

## 2018-10-21 NOTE — Progress Notes (Signed)
Patient ID: Joy Murray, female   DOB: [birth year], 37 y.o.   MRN: [id number]  Baby breech on bedside US done by CNM. Discussed delivery options with patient and her husband: attempt a version, cesarean delivery or breech vaginal delivery, including risks and benefits. Patient opted for cesarean delivery.  The risks of cesarean section discussed with the patient included but were not limited to: bleeding which may require transfusion or reoperation; infection which may require antibiotics; injury to bowel, bladder, ureters or other surrounding organs; injury to the fetus; need for additional procedures including hysterectomy in the event of a life-threatening hemorrhage; placental abnormalities wth subsequent pregnancies, incisional problems, thromboembolic phenomenon and other postoperative/anesthesia complications. The patient concurred with the proposed plan, giving informed written consent for the procedure.   Patient has been NPO since 3pm, she will remain NPO for procedure. Anesthesia and OR aware.  Preoperative prophylactic Ancef ordered on call to the OR.  To OR when ready.  Levie Heritage, DO 10/21/2018 6:37 PM

## 2018-10-21 NOTE — Procedures (Signed)
Joy Murray Dec 07, 1981 [redacted]w[redacted]d  Fetus A Non-Stress Test Interpretation for 10/21/18  Indication: IUGR  Fetal Heart Rate A Mode: External Baseline Rate (A): 115 bpm Variability: Moderate Accelerations: 15 x 15 Decelerations: None Multiple birth?: No  Uterine Activity Mode: Toco Contraction Frequency (min): none noted  Interpretation (Fetal Testing) Nonstress Test Interpretation: Reactive Comments: FHR tracing rev'd by Dr. Grace Bushy

## 2018-10-21 NOTE — Anesthesia Postprocedure Evaluation (Signed)
Anesthesia Post Note  Patient: Joy Murray [place]  Procedure(s) Performed: CESAREAN SECTION (N/A )     Patient location during evaluation: PACU Anesthesia Type: Spinal Level of consciousness: oriented and awake and alert Pain management: pain level controlled Vital Signs Assessment: post-procedure vital signs reviewed and stable Respiratory status: spontaneous breathing, respiratory function stable, patient connected to nasal cannula oxygen and nonlabored ventilation Cardiovascular status: blood pressure returned to baseline and stable Postop Assessment: no headache, no backache, no apparent nausea or vomiting, patient able to bend at knees and spinal receding Anesthetic complications: no    Last Vitals:  Vitals:   10/21/18 2315 10/21/18 2330  BP: (!) 111/56   Pulse:    Resp: 16   Temp:  36.4 C  SpO2: 100%     Last Pain:  Vitals:   10/21/18 2315  TempSrc:   PainSc: 7    Pain Goal:                 Joy Murray A.
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
## 2018-10-21 NOTE — Progress Notes (Signed)
Patient ID: Joy Murray, female   DOB: [birth year], 37 y.o.   MRN: [id number] Breech presentation verified on informal BS [place]. Called MFM [place] Tech who also reports that baby was breech. Dr. Adrian Blackwater to discuss ECV vs C/S w/ pt.  Katrinka Blazing, [place], CNM 10/21/2018 5:10 PM

## 2018-10-22 ENCOUNTER — Encounter: Payer: Self-pay | Admitting: Obstetrics and Gynecology

## 2018-10-22 ENCOUNTER — Encounter (HOSPITAL_COMMUNITY): Payer: Self-pay | Admitting: Family Medicine

## 2018-10-22 ENCOUNTER — Other Ambulatory Visit: Payer: Self-pay

## 2018-10-22 LAB — CBC
HCT: 29.9 % — ABNORMAL LOW (ref 36.0–46.0)
HEMOGLOBIN: 10.5 g/dL — AB (ref 12.0–15.0)
MCH: 32.4 pg (ref 26.0–34.0)
MCHC: 35.1 g/dL (ref 30.0–36.0)
MCV: 92.3 fL (ref 80.0–100.0)
Platelets: 278 10*3/uL (ref 150–400)
RBC: 3.24 MIL/uL — ABNORMAL LOW (ref 3.87–5.11)
RDW: 13.6 % (ref 11.5–15.5)
WBC: 16 10*3/uL — ABNORMAL HIGH (ref 4.0–10.5)
nRBC: 0 % (ref 0.0–0.2)

## 2018-10-22 LAB — SYPHILIS: RPR W/REFLEX TO RPR TITER AND TREPONEMAL ANTIBODIES, TRADITIONAL SCREENING AND DIAGNOSIS ALGORITHM: RPR Ser Ql: NONREACTIVE

## 2018-10-22 MED ORDER — MENTHOL 3 MG MT LOZG: 1.0000 | LOZENGE | OROMUCOSAL | Status: DC | PRN

## 2018-10-22 MED ORDER — OXYTOCIN 40 UNITS IN LACTATED RINGERS INFUSION - SIMPLE MED: 2.5000 [IU]/h | INTRAVENOUS | Status: AC

## 2018-10-22 MED ORDER — BUPRENORPHINE HCL 8 MG SL SUBL
8.0000 mg | SUBLINGUAL_TABLET | Freq: Three times a day (TID) | SUBLINGUAL | Status: DC
  Administered 2018-10-22: 8 mg via SUBLINGUAL
  Filled 2018-10-22: qty 1

## 2018-10-22 MED ORDER — DIPHENHYDRAMINE HCL 25 MG PO CAPS: 25.0000 mg | ORAL_CAPSULE | Freq: Four times a day (QID) | ORAL | Status: DC | PRN

## 2018-10-22 MED ORDER — IBUPROFEN 600 MG PO TABS
600.0000 mg | ORAL_TABLET | Freq: Four times a day (QID) | ORAL | Status: DC
  Administered 2018-10-22 – 2018-10-24 (×9): 600 mg via ORAL
  Filled 2018-10-22 (×9): qty 1

## 2018-10-22 MED ORDER — WITCH HAZEL-GLYCERIN EX PADS: 1.0000 "application " | MEDICATED_PAD | CUTANEOUS | Status: DC | PRN

## 2018-10-22 MED ORDER — ACETAMINOPHEN 325 MG PO TABS
650.0000 mg | ORAL_TABLET | ORAL | Status: DC | PRN
  Administered 2018-10-22 – 2018-10-24 (×6): 650 mg via ORAL
  Filled 2018-10-22 (×6): qty 2

## 2018-10-22 MED ORDER — BUPRENORPHINE HCL 8 MG SL SUBL
8.0000 mg | SUBLINGUAL_TABLET | Freq: Three times a day (TID) | SUBLINGUAL | Status: DC
  Administered 2018-10-22 – 2018-10-24 (×5): 8 mg via SUBLINGUAL
  Filled 2018-10-22 (×5): qty 1

## 2018-10-22 MED ORDER — SIMETHICONE 80 MG PO CHEW
80.0000 mg | CHEWABLE_TABLET | Freq: Three times a day (TID) | ORAL | Status: DC
  Administered 2018-10-22 – 2018-10-24 (×8): 80 mg via ORAL
  Filled 2018-10-22 (×8): qty 1

## 2018-10-22 MED ORDER — ACETAMINOPHEN 500 MG PO TABS
1000.0000 mg | ORAL_TABLET | Freq: Once | ORAL | Status: AC
  Administered 2018-10-22: 1000 mg via ORAL
  Filled 2018-10-22: qty 2

## 2018-10-22 MED ORDER — COCONUT OIL OIL: 1.0000 "application " | TOPICAL_OIL | Status: DC | PRN

## 2018-10-22 MED ORDER — FERROUS SULFATE 325 (65 FE) MG PO TABS
325.0000 mg | ORAL_TABLET | Freq: Every day | ORAL | Status: DC
  Administered 2018-10-22 – 2018-10-23 (×3): 325 mg via ORAL
  Filled 2018-10-22 (×3): qty 1

## 2018-10-22 MED ORDER — MEASLES, MUMPS & RUBELLA VAC ~~LOC~~ INJ
0.5000 mL | INJECTION | Freq: Once | SUBCUTANEOUS | Status: DC
  Filled 2018-10-22: qty 0.5

## 2018-10-22 MED ORDER — POLYETHYLENE GLYCOL 3350 17 G PO PACK
17.0000 g | PACK | Freq: Every day | ORAL | Status: DC | PRN
  Filled 2018-10-22: qty 1

## 2018-10-22 MED ORDER — DOCUSATE SODIUM 100 MG PO CAPS
100.0000 mg | ORAL_CAPSULE | Freq: Two times a day (BID) | ORAL | Status: DC
  Administered 2018-10-22 – 2018-10-24 (×6): 100 mg via ORAL
  Filled 2018-10-22 (×6): qty 1

## 2018-10-22 MED ORDER — ZOLPIDEM TARTRATE 5 MG PO TABS: 5.0000 mg | ORAL_TABLET | Freq: Every evening | ORAL | Status: DC | PRN

## 2018-10-22 MED ORDER — SENNOSIDES-DOCUSATE SODIUM 8.6-50 MG PO TABS
2.0000 | ORAL_TABLET | ORAL | Status: DC
  Administered 2018-10-22 – 2018-10-23 (×3): 2 via ORAL
  Filled 2018-10-22 (×3): qty 2

## 2018-10-22 MED ORDER — PNEUMOCOCCAL VAC POLYVALENT 25 MCG/0.5ML IJ INJ
0.5000 mL | INJECTION | INTRAMUSCULAR | Status: DC
  Filled 2018-10-22: qty 0.5

## 2018-10-22 MED ORDER — DIBUCAINE 1 % RE OINT: 1.0000 "application " | TOPICAL_OINTMENT | RECTAL | Status: DC | PRN

## 2018-10-22 MED ORDER — PRENATAL MULTIVITAMIN CH
1.0000 | ORAL_TABLET | Freq: Every day | ORAL | Status: DC
  Administered 2018-10-22 – 2018-10-24 (×3): 1 via ORAL
  Filled 2018-10-22 (×3): qty 1

## 2018-10-22 MED ORDER — SIMETHICONE 80 MG PO CHEW: 80.0000 mg | CHEWABLE_TABLET | ORAL | Status: DC | PRN

## 2018-10-22 MED ORDER — LACTATED RINGERS IV SOLN
INTRAVENOUS | Status: DC
  Administered 2018-10-22: 03:00:00 via INTRAVENOUS

## 2018-10-22 MED ORDER — SIMETHICONE 80 MG PO CHEW
80.0000 mg | CHEWABLE_TABLET | ORAL | Status: DC
  Administered 2018-10-22 – 2018-10-23 (×3): 80 mg via ORAL
  Filled 2018-10-22 (×3): qty 1

## 2018-10-22 MED ORDER — TETANUS-DIPHTH-ACELL PERTUSSIS 5-2.5-18.5 LF-MCG/0.5 IM SUSP: 0.5000 mL | Freq: Once | INTRAMUSCULAR | Status: DC

## 2018-10-22 NOTE — Progress Notes (Signed)
Subjective: Postpartum Day 1: Cesarean Delivery Patient reports incisional pain and tolerating PO.    Objective: Vital signs in last 24 hours: Temp:  [97.3 F (36.3 C)-98.7 F (37.1 C)] 98.7 F (37.1 C) (10/30 0615) Pulse Rate:  [55-77] 55 (10/30 0615) Resp:  [14-18] 18 (10/30 0615) BP: (97-119)/(44-63) 97/47 (10/30 0615) SpO2:  [97 %-100 %] 97 % (10/30 0200) Weight:  [85 kg-85.5 kg] 85 kg (10/29 1556)  Physical Exam:  General: alert, cooperative and no distress Lochia: appropriate Uterine Fundus: firm Incision: no dehiscence, no significant erythema DVT Evaluation: No evidence of DVT seen on physical exam. No cords or calf tenderness. No significant calf/ankle edema.  Recent Labs    10/21/18 1616 10/22/18 0542  HGB 11.3* 10.5*  HCT 32.3* 29.9*    Assessment/Plan: Status post Cesarean section. Doing well postoperatively.  Lactation support D/c foley Continue current care.  Joy Murray 10/22/2018, 7:59 AM

## 2018-10-22 NOTE — Lactation Note (Addendum)
This note was copied from a baby's chart. Lactation Consultation Note Baby 5 hrs old. Mom states baby BF right after deliver, nut hasn't been interested since then. FOB changed diaper, baby fussing. LC placed baby in football position. Baby will not open or suck. W/gloved finger attempted to stimulate baby to suckle. Chomped on finger a few times. Green information sheet given and reviewed regarding supplementation, STS, I&O, monitoring for hypoglycemia. Reviewed newborn behavior, cluster feeding, activity tolerance supply and demand. Mom has large everted nipples. Hand expression demonstrated w/thick colostrum to tip of nipple. Mom pulled herself up in bed. Mom cried d/t pain. Mom stated it hurts to move at all. Reported to RN. Discussed w/chagre RN regarding type of formula to give baby. Charge RN stated given 22 cal until 24 hrs old then there is an order set to give 24 cal. Similac for NAS babies. Discussed w/mom positioning, support, comfort while BF. FOB at bedside and supportive. [facility] taught pace feeding. Baby took 7ml formula. Discussed importance of baby getting colostrum and BM. Mom encouraged to feed baby 8-12 times/24 hours and with feeding cues.  Encouraged to call for assistance, baby not feeding, or questions. Mom polite but gets irritable if she has to move or gets assessments. WH/LC brochure given w/resources, support groups and LC services.  Patient Name: Joy Murray [id number] Date: 10/22/2018 Reason for consult: Initial assessment;Infant < 6lbs;Early term 37-38.6wks   Maternal Data Has patient been taught Hand Expression?: Yes Does the patient have breastfeeding experience prior to this delivery?: Yes  Feeding Feeding Type: Formula Nipple Type: Slow - flow  LATCH Score Latch: Too sleepy or reluctant, no latch achieved, no sucking elicited.  Audible Swallowing: None  Type of Nipple: Everted at rest and after stimulation  Comfort (Breast/Nipple): Soft /  non-tender  Hold (Positioning): Full assist, staff holds infant at breast  LATCH Score: 4  Interventions Interventions: Breast feeding basics reviewed;Support pillows;Assisted with latch;Position options;Skin to skin;Breast massage;Hand express;Breast compression;Adjust position(asked RN to set up DEBP)  Lactation Tools Discussed/Used Pump Review: Milk Storage   Consult Status Consult Status: Follow-up Date: 10/22/18 Follow-up type: In-patient    Lyall Faciane, Diamond Nickel 10/22/2018, 2:34 AM

## 2018-10-22 NOTE — Lactation Note (Signed)
This note was copied from a baby's chart. Lactation Consultation Note  Patient Name: Joy Murray [id number] Date: 10/22/2018 Reason for consult: Follow-up assessment;Infant < 6lbs;Early term 37-38.6wks;Other (Comment)(Eat. sleep, and console)  Visited with Mom and FOB at 15 hrs post delivery.  FOB bottle feeding baby formula, as baby is IUGR.    Mom has a DEBP set up at bedside, and states she "tried to pump" once.  Encouraged her to continue to pump both breasts every 2-3 hrs during the day, and once at night, to support a full milk supply.  Encouraged STS as much as possible, to help stimulate her milk supply and help baby with any withdrawal symptoms.    Reviewed breast massage and hand expression with Mom.  Encouraged her to feed baby any colostrum she can express.   Handout on pace bottle feeding given, and assisted FOB on this.  Baby has taken 14 ml already at this feeding.  Mom has [facility], referral faxed for DEBP on discharge.   Reviewed importance of taking apart pump parts and washing and rinsing and letting them air dry between pumping.   Mom denies any questions currently.   Interventions Interventions: Breast feeding basics reviewed;Skin to skin;Breast massage;Hand express;DEBP  Lactation Tools Discussed/Used WIC Program: Yes Pump Review: Setup, frequency, and cleaning;Milk Storage Initiated by:: RN Date initiated:: 10/22/18   Consult Status Consult Status: Follow-up Date: 10/23/18 Follow-up type: In-patient    Judee Clara 10/22/2018, 12:59 PM

## 2018-10-22 NOTE — Progress Notes (Signed)
POSTPARTUM PROGRESS NOTE  POD #1  Subjective:  Joy Murray is a 37 y.o. [phone] s/p pLTCS at [redacted]w[redacted]d. No acute events overnight. She reports she is doing well. She denies any problems with ambulating, voiding or po intake. Denies nausea or vomiting. She has passed flatus but no BM yet. Pain is moderately controlled on Toradol and Tylenol.  Lochia is moderate.  Objective: Blood pressure (!) 97/47, pulse (!) 55, temperature 98.7 F (37.1 C), temperature source Oral, resp. rate 18, height 5\' 7"  (1.702 m), weight 85 kg, last menstrual period 02/16/2018, SpO2 97 %, unknown if currently breastfeeding.  Physical Exam:  General: alert, cooperative and no distress Chest: no respiratory distress Heart:regular rate, distal pulses intact Abdomen: soft, nontender,  Uterine Fundus: firm, appropriately tender DVT Evaluation: No calf swelling or tenderness Extremities: mild LE edema Skin: warm, dry; incision clean/dry/intact w/honeycomb dressing in place  Recent Labs    10/21/18 1616 10/22/18 0542  HGB 11.3* 10.5*  HCT 32.3* 29.9*    Assessment/Plan: Joy Murray is a 37 y.o. [id number] s/p pLTCS at [redacted]w[redacted]d for breech.  POD#1 - Doing welll; pain moderately controlled on Toradol and Tylenol. Fentanyl made available but will try to avoid given polysubstance abuse hx. H/H appropriate  Routine postpartum care  OOB, ambulated  Lovenox for VTE prophylaxis Anemia: asymptomatic  Cont po ferrous sulfate BID Contraception: Nexplanon Feeding: Breast and Bottle  Dispo: Plan for discharge on 11/1.   LOS: 1 day   Matthew Saras, [facility] MS3 10/22/2018, 7:59 AM

## 2018-10-22 NOTE — Anesthesia Postprocedure Evaluation (Signed)
Anesthesia Post Note  Patient: Lucillia Corson [place]  Procedure(s) Performed: CESAREAN SECTION (N/A )     Patient location during evaluation: Mother Baby Anesthesia Type: Spinal Level of consciousness: awake, awake and alert and oriented Pain management: pain level controlled Vital Signs Assessment: post-procedure vital signs reviewed and stable Respiratory status: spontaneous breathing, nonlabored ventilation and respiratory function stable Cardiovascular status: stable Postop Assessment: no headache, no backache, patient able to bend at knees, no apparent nausea or vomiting, adequate PO intake and able to ambulate Anesthetic complications: no    Last Vitals:  Vitals:   10/22/18 0615 10/22/18 0927  BP: (!) 97/47 (!) 104/45  Pulse: (!) 55 68  Resp: 18   Temp: 37.1 C 36.7 C  SpO2:  99%    Last Pain:  Vitals:   10/22/18 0927  TempSrc: Oral  PainSc:    Pain Goal:                 Geza Beranek

## 2018-10-23 NOTE — Progress Notes (Signed)
POSTPARTUM PROGRESS NOTE  POD #2  Subjective:  Joy Murray is a 37 y.o. [phone] s/p pLTCS at [redacted]w[redacted]d.  She reports she doing well. No acute events overnight. She reports she is doing well. She denies any problems with ambulating, voiding or po intake. Denies nausea or vomiting. She has passed flatus. Pain is well controlled.  Lochia is slowing down.  Objective: Blood pressure 94/81, pulse 68, temperature 97.9 F (36.6 C), temperature source Oral, resp. rate 18, height 5\' 7"  (1.702 m), weight 85 kg, last menstrual period 02/16/2018, SpO2 100 %, unknown if currently breastfeeding.  Physical Exam:  General: alert, cooperative and no distress Chest: no respiratory distress Heart:regular rate, distal pulses intact Abdomen: soft, nontender,  Uterine Fundus: firm, appropriately tender DVT Evaluation: No calf swelling or tenderness Extremities: no edema Skin: warm, dry; incision clean/dry/intact w/ honeycomb dressing in place  Recent Labs    10/21/18 1616 10/22/18 0542  HGB 11.3* 10.5*  HCT 32.3* 29.9*    Assessment/Plan: Joy Murray is a 37 y.o. [id number] s/p pLTCS at [redacted]w[redacted]d for breech.  POD#2 - Doing welll; pain well controlled. H/H appropriate  Routine postpartum care  OOB, ambulated  Lovenox for VTE prophylaxis Contraception: Nexplanon Feeding: Both breast and bottle  Dispo: Plan for discharge tomorrow.   LOS: 2 days   Eli Hose PA Student 10/23/2018, 7:37 AM

## 2018-10-23 NOTE — Lactation Note (Signed)
This note was copied from a baby's chart. Lactation Consultation Note Baby 69 hrs old Discussed w/CN RN about Sleep, Eat, Console standing orders for NAS babies after 24 hrs old. Baby isn't feeding well on the breast. Mom attempting to BF when LC came to rm. Baby would suck a few times then pull off. Mom holding baby in cross cradle position. Mom does have large everted nipples. Nipples fit into mouth, baby doesn't have a deep latch d/t not a good seal around nipple. Can see nipple in the corner of baby's mouth. Baby isn't gagging. Latches on and suckles then stops. Encouraged mom to keep trying to BF, then pumping.  Noted documentation shows all formula feedings. Discussed w/mom if baby doesn't feed on the breast, baby needs to have more than a supplement amount. Reviewed formula information for formula feeders, and reviewed again supplemental amount for LPI less than 6 lbs. Stressed importance of I&O d/t wt.  Mom and FOB active in care and attentive to [facility] information.  FOB in BR. Discussed w/mom importance of pumping, hand expressing and BF to give baby mom's milk to help w/withdrawls. Discussed increasing the calories to 24 cal. For the ESC protocol for NAS babies. Mom stated OK. Reported to RN. Asked RN to activate ESC order.  Patient Name: Boy Pierce [place] [id number] Date: 10/23/2018 Reason for consult: Follow-up assessment;Early term 37-38.6wks;Infant < 6lbs;Other (Comment)(NAS baby)   Maternal Data    Feeding Feeding Type: Formula Nipple Type: Slow - flow  LATCH Score Latch: Repeated attempts needed to sustain latch, nipple held in mouth throughout feeding, stimulation needed to elicit sucking reflex.  Audible Swallowing: None  Type of Nipple: Everted at rest and after stimulation  Comfort (Breast/Nipple): Soft / non-tender  Hold (Positioning): No assistance needed to correctly position infant at breast.  LATCH Score: 7  Interventions Interventions: Support pillows;Adjust  position  Lactation Tools Discussed/Used     Consult Status Consult Status: Follow-up Date: 10/24/18 Follow-up type: In-patient    Khalil Szczepanik, Diamond Nickel 10/23/2018, 2:53 AM

## 2018-10-23 NOTE — Clinical Social Work Maternal (Signed)
CLINICAL SOCIAL WORK MATERNAL/CHILD NOTE  Patient Details  Name: Joy Murray MRN: [id number] Date of Birth: 04/02/1981  Date:  10/23/2018  Clinical Social Worker Initiating Note:  Miryah Ralls Boyd-Gilyard          Date/Time: Initiated:  10/22/18/1057             Child's Name:  Joy Polland Jr.    Biological Parents:  Mother, Father   Need for Interpreter:  None   Reason for Referral:  Current Substance Use/Substance Use During Pregnancy    Address:  [address] [place]  [address]    Phone number:  [phone] (home)      Additional phone number:   Household Members/Support Persons (HM/SP):   Household Member/Support Person 1, Household Member/Support Person 2   HM/SP Name Relationship DOB or Age  HM/SP -1 Christian Rowe son  06/22/1998  HM/SP -2 Daniel Rowe son 08/30/2006  HM/SP -3     HM/SP -4     HM/SP -5     HM/SP -6     HM/SP -7     HM/SP -8       Natural Supports (not living in the home): Friends, Immediate Family, Parent   Professional Supports:Therapist(MOB receives outpatient counseling at [facility]. )   Employment:Unemployed   Type of Work:     Education:      Homebound arranged:    Financial Resources:Medicaid   Other Resources: Food Stamps , WIC   Cultural/Religious Considerations Which May Impact Care: None reported  Strengths: Compliance with medical plan , Ability to meet basic needs , Home prepared for child , Pediatrician chosen, Psychotropic Medications, Understanding of illness   Psychotropic Medications:  Subutex      Pediatrician:    [place] area  Pediatrician List:   [facility]  [place]   [place]   [place]   [place]   [place]     Pediatrician Fax Number:    Risk Factors/Current Problems: Substance Use , Mental Health Concerns    Cognitive State: Alert , Able to Concentrate , Insightful ,  Linear Thinking    Mood/Affect: Happy , Relaxed , Interested , Comfortable , Bright    CSW Assessment:CSW met with MOB at bedside to discuss consult for Subutex use, hx of drug abuse and hx of depression anxiety. FOB present and voluntarily left room so CSW could speak with MOB. MOB informed CSW that she currently uses Subutex to treat opioid dependence. MOB shared that she had a hx of opioid dependence due to chronic back pain experienced during her twenties. MOB reported that Subutex has been effective and that it is managed by [facility]. MOB shared that she is currently receiving therapy services through [facility] (Therapist: Maggie). MOB denied any substance use during pregnancy and reported that she does not feel that she has a current substance abuse problem.   CSW provided education regarding the baby blues period vs. perinatal mood disorders, discussed treatment and gave resources for mental health follow up if concerns arise.  CSW recommends self-evaluation during the postpartum time period using the New Mom Checklist from Postpartum Progress and encouraged MOB to contact a medical professional if symptoms are noted at any time.   MOB reported that she is aware of Sudden Infant Death Syndrome (SIDS) precautions and declined further education.   CSW notified MOB about baby's UDS/UCDS, MOB verbalized understanding.  MOB remained engaged throughout assessment and appeared to be   notified MOB about baby's UDS/UCDS, MOB verbalized understanding.  MOB remained engaged throughout assessment and appeared to be bonding with infant as evidenced by skin to skin. MOB reported that her support system consists of her sisters and friend. CSW will continue to follow baby while in the hospital and awaiting pending UDS/UCDS. CSW will make a report to [facility] CPS if warranted.   CSW Plan/Description:  No Further Intervention Required/No Barriers to Discharge, Sudden Infant Death Syndrome (SIDS) Education, Perinatal Mood and Anxiety Disorder (PMADs) Education, Neonatal Abstinence Syndrome (NAS) Education, Other  Information/Referral to [place], CSW Will Continue to Monitor Umbilical Cord Tissue Drug Screen Results and Make Report if Rosey Bath, LCSW 10/23/2018, 9:01 AM

## 2018-10-24 MED ORDER — IBUPROFEN 600 MG PO TABS: 600.0000 mg | ORAL_TABLET | Freq: Four times a day (QID) | ORAL | 2 refills | Status: AC | PRN

## 2018-10-24 MED ORDER — ACETAMINOPHEN 500 MG PO TABS: 1000.0000 mg | ORAL_TABLET | Freq: Four times a day (QID) | ORAL | 2 refills | Status: AC | PRN

## 2018-10-24 MED ORDER — GABAPENTIN 300 MG PO CAPS: 600.0000 mg | ORAL_CAPSULE | Freq: Every day | ORAL | 1 refills | Status: AC

## 2018-10-24 NOTE — Lactation Note (Addendum)
This note was copied from a baby's chart. Lactation Consultation Note  Patient Name: Joy Murray [id number] Date: 10/24/2018 Reason for consult: Follow-up assessment Mom had stated she wanted to get a DEBP [facility] loaner pump.  Mom reports she had to wait on dad to get back so she could go get money out of ATM. When went back about 30 minutes later as mom had requested.  Dad was there and he said he did not want to give money towards a loaner pump.  Had discussed with mom that she would get her 30.00 back once pump was returned and LC that had seen her previously had also discussed this with her also.  Showed mom how to use conversion kit as both double and single manual mump.  In addition, gave mom Harmony breastpump and Demo how to use it.  Mom reports she is good with that.  Plans to stay home anyway so this may be all she needs.  Urged to call as needed   Maternal Data    Feeding    LATCH Score                   Interventions    Lactation Tools Discussed/Used     Consult Status Consult Status: Complete Date: 10/24/18 Follow-up type: Call as needed    [facility] 10/24/2018, 4:23 PM

## 2018-10-24 NOTE — Discharge Instructions (Signed)

## 2018-10-24 NOTE — Lactation Note (Addendum)
This note was copied from a baby's chart. Lactation Consultation Note  Patient Name: Joy Murray [id number] Date: 10/24/2018 Reason for consult: Follow-up assessment;Infant < 6lbs;Early term 37-38.6wks  P3, 51 hour female infant BF concerns: not sustain latch, IUGR, less than 5 lbs, ETI  Per mom, only used DEBP twice yesterday. LC ask mom hand express and mom easily expressed 2 ml of colostrum from right breast that was spoon feed to infant. LC assisted mom in latching infant to right breast in football hold position, infant would suckle a few minutes but not sustain latch for long period of time. Infant was supplemented with 35 ml of 24kcal Similac with iron by Dad, while mom started using DEBP,  mom pumped 6 ml of breast milk. Per mom, she will pump every 3 hours as advised by LC earlier.  LC sent referral to [facility] program for DEBP once mom is discharge.  Mom's feeding plan: 1. Continue work towards latching infant to breast 15 minutes. Latch infant to breast first. 2. Will give infant her EBM at next feeding that she has pumped with DEBP. 3. Supplement with formula afterwards based on infant age / hours.  Maternal Data    Feeding Feeding Type: Breast Fed Nipple Type: Slow - flow  LATCH Score Latch: Repeated attempts needed to sustain latch, nipple held in mouth throughout feeding, stimulation needed to elicit sucking reflex.  Audible Swallowing: A few with stimulation  Type of Nipple: Everted at rest and after stimulation  Comfort (Breast/Nipple): Soft / non-tender  Hold (Positioning): Assistance needed to correctly position infant at breast and maintain latch.  LATCH Score: 7  Interventions Interventions: Assisted with latch;Breast compression;Hand express;DEBP;Breast massage  Lactation Tools Discussed/Used     Consult Status Consult Status: Follow-up Date: 10/24/18 Follow-up type: In-patient    Danelle Earthly 10/24/2018, 12:30 AM

## 2018-10-28 ENCOUNTER — Ambulatory Visit (HOSPITAL_COMMUNITY): Payer: Medicaid Other

## 2018-10-31 IMAGING — US US MFM OB LIMITED
1 series · 13 of 28 positions shown · non-contrast
Comparison: none

[Series 1: us mfm ob limited · 30 acquisitions, 13 frames shown]
[im 2/30]
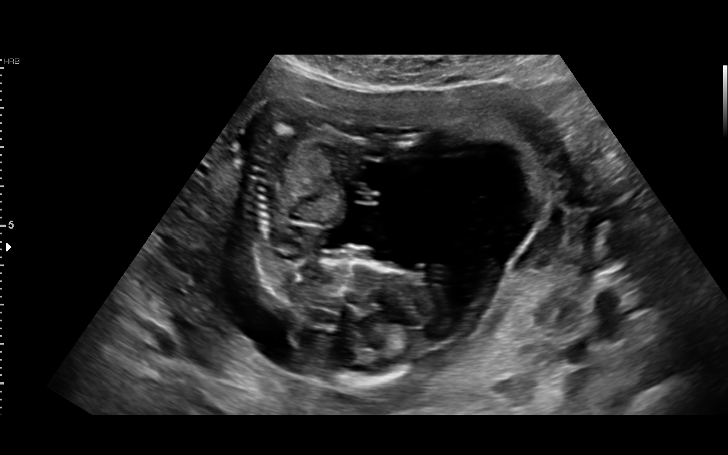
[im 4/30]
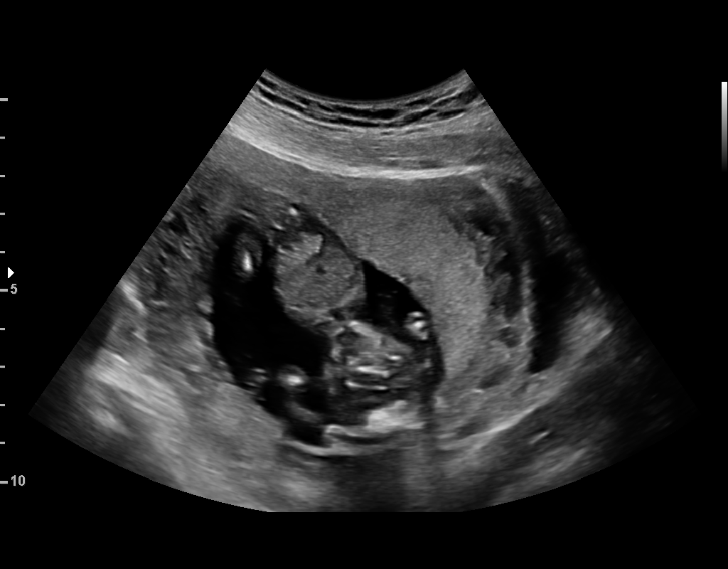
[im 6/30]
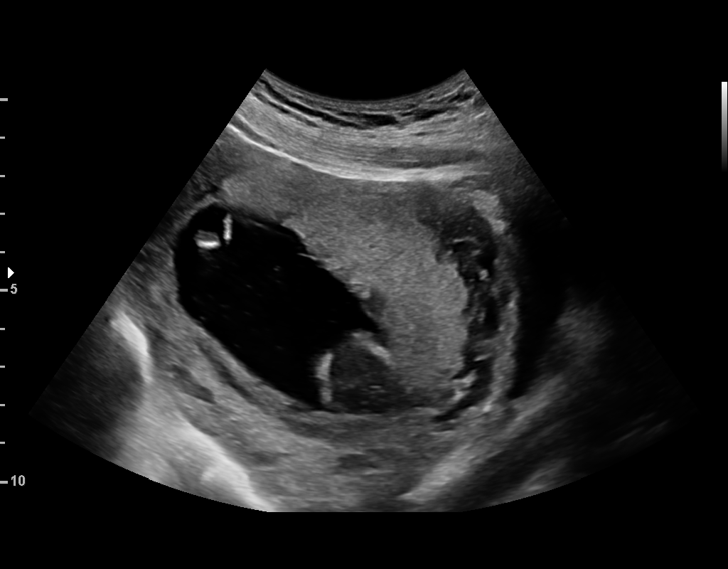
[im 8/30]
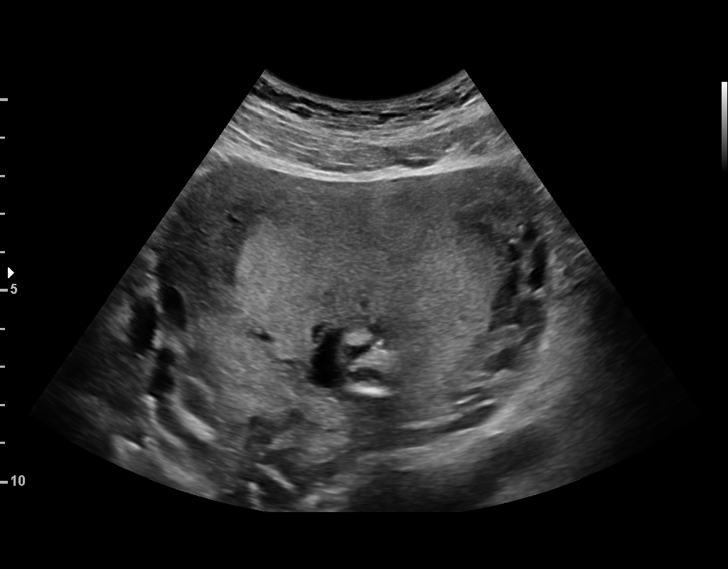
[im 10/30]
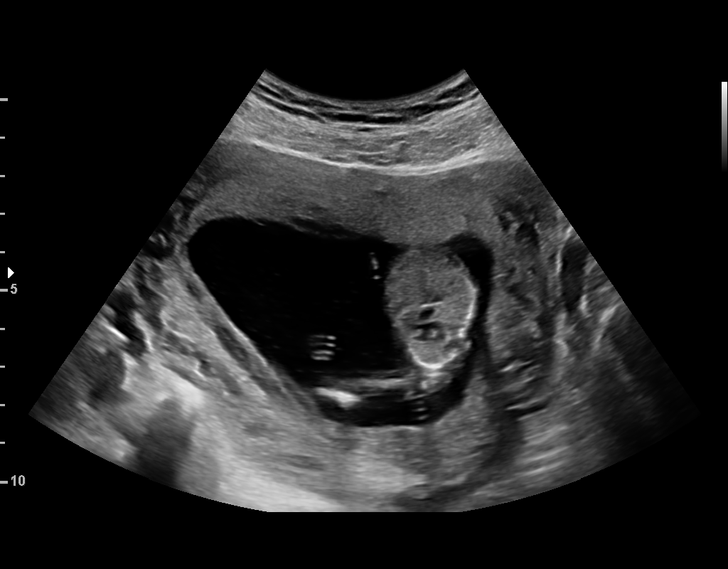
[im 12/30]
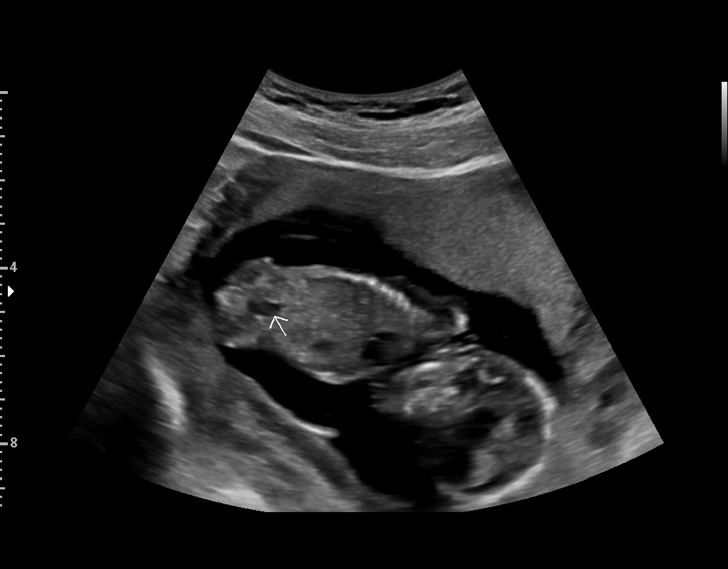
[im 16/30]
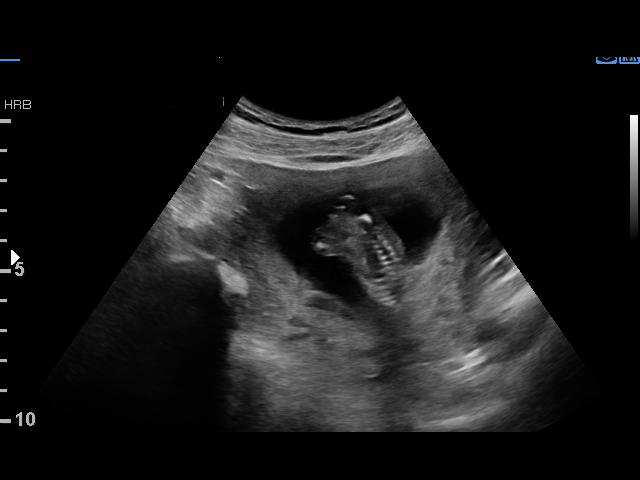
[im 18/30]
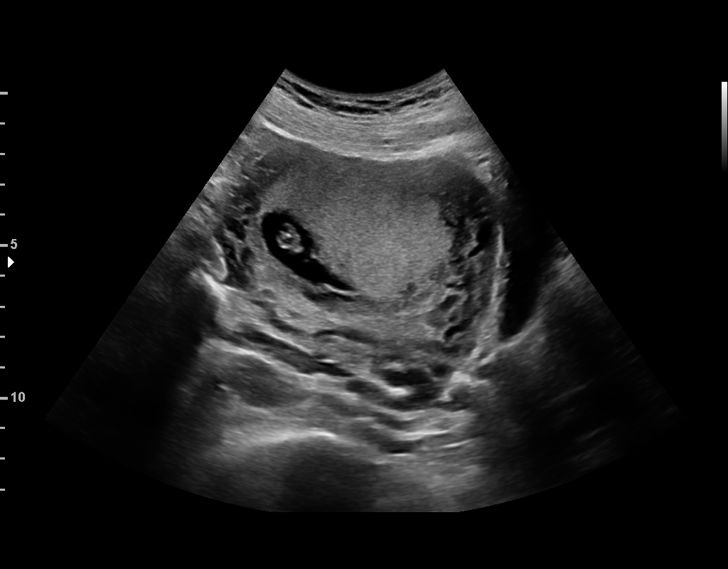
[im 20/30]
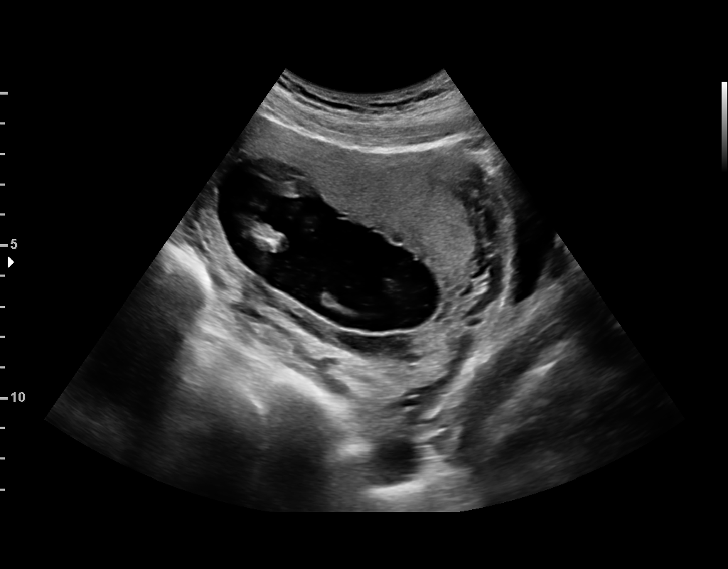
[im 22/30]
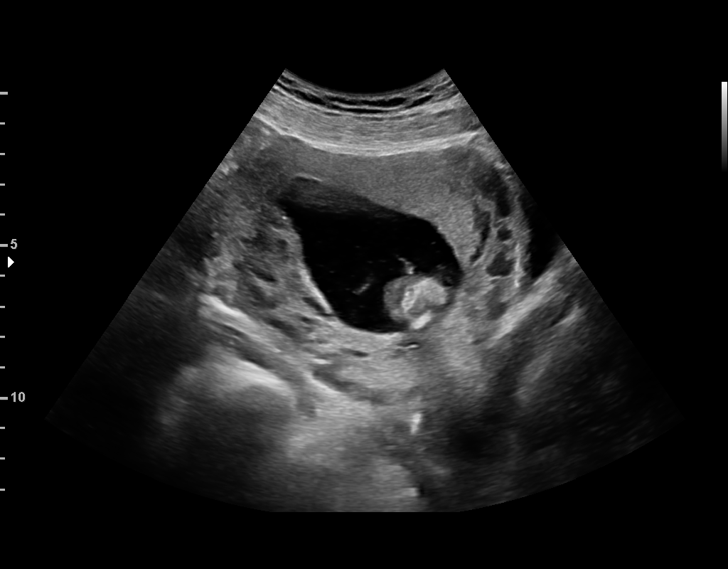
[im 24/30]
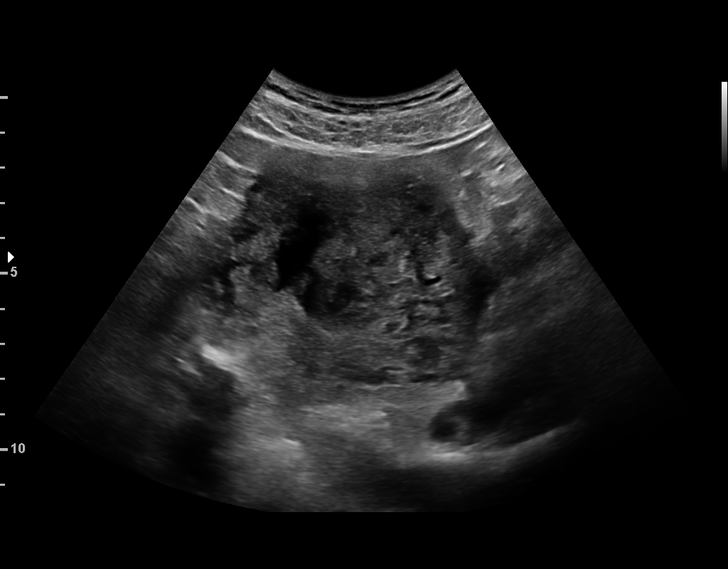
[im 26/30]
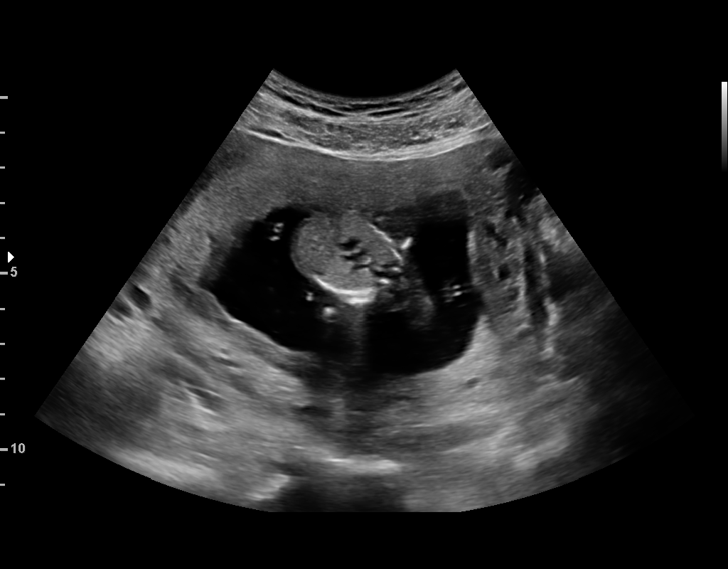
[im 28/30]
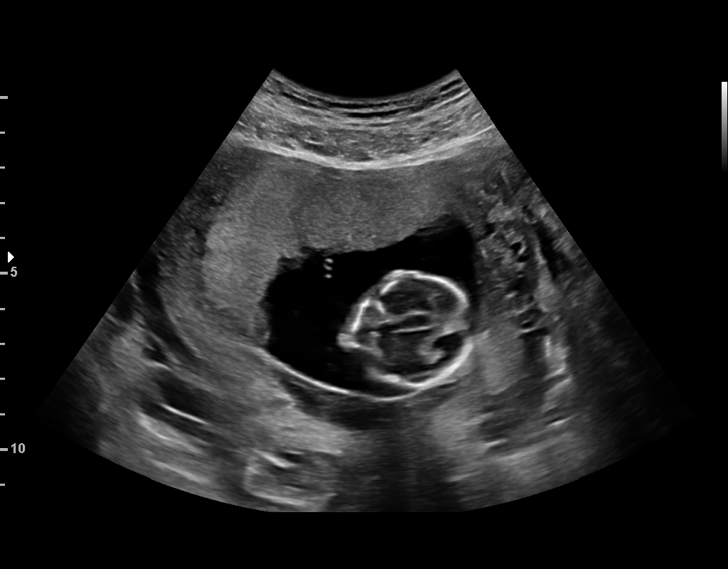

[13 of 28 positions shown; findings below may reference images not displayed]

[facility]ALTON NP

1  PAK YO FUNK            [id number]      [id number]     [id number]
Indications

14 weeks gestation of pregnancy
Advanced maternal age multigravida 35+,
second trimester
Tobacco use complicating pregnancy,
second trimester
Pregnancy comlicated by subutex                O99.320, F11.20,
maintenance, antepartum (hx polysubstance
abuse, rehab 7248, now clean)
Vaginal bleeding in pregnancy, second
trimester (brown blood this am)
Lt sided Pelvic pain affecting pregnancy in
second trimester
OB History

Blood Type:            Height:  5'7"   Weight (lb):  170       BMI:
Gravidity:    4         Term:   2         SAB:   2
Living:       2
Fetal Evaluation

Num Of Fetuses:     1
Fetal Heart         144
Rate(bpm):
Cardiac Activity:   Observed
Presentation:       Cephalic
Placenta:           Anterior

Amniotic Fluid
AFI FV:      Subjectively within normal limits

Largest Pocket(cm)
4.93
Gestational Age

Best:          14w 2d     Det. By:  Previous Ultrasound      EDD:   11/09/18
(04/13/18)
Anatomy

Thoracic:              Appears normal         Abdomen:                Appears normal
Stomach:               Appears normal, left   Bladder:                Appears normal
sided
Cervix Uterus Adnexa

Cervix
Normal appearance by transabdominal scan.

Uterus
No abnormality visualized.

Left Ovary
Not visualized. No adnexal mass visualized.

Right Ovary
Not visualized. No adnexal mass visualized.

Cul De Sac:   No free fluid seen.

Adnexa:       No abnormality visualized.
Impression

Singe intrauterine pregnancy at 14w 2d.
Normal amniotic fluid.
Limited anatomy appears normal for gestational age.
Recommendations

Follow-up for complete anatomic survey at 18 weeks.
Genetic counseling today, see separate consult note.

## 2018-11-04 ENCOUNTER — Ambulatory Visit (HOSPITAL_COMMUNITY): Payer: Medicaid Other

## 2018-11-05 ENCOUNTER — Ambulatory Visit: Payer: Self-pay

## 2018-12-05 ENCOUNTER — Ambulatory Visit: Payer: Self-pay | Admitting: Family Medicine

## 2018-12-12 ENCOUNTER — Ambulatory Visit (INDEPENDENT_AMBULATORY_CARE_PROVIDER_SITE_OTHER): Payer: Medicaid Other | Admitting: Family Medicine

## 2018-12-12 ENCOUNTER — Encounter: Payer: Self-pay | Admitting: Family Medicine

## 2018-12-12 DIAGNOSIS — M130 Polyarthritis, unspecified: Secondary | ICD-10-CM

## 2018-12-12 DIAGNOSIS — Z1389 Encounter for screening for other disorder: Secondary | ICD-10-CM

## 2018-12-12 MED ORDER — DICLOFENAC SODIUM 75 MG PO TBEC: 75.0000 mg | DELAYED_RELEASE_TABLET | Freq: Two times a day (BID) | ORAL | 2 refills | Status: AC

## 2018-12-12 NOTE — Progress Notes (Signed)
Subjective:     Joy Murray is a 37 y.o. female who presents for a postpartum visit. She is 6 week postpartum following a low cervical transverse Cesarean section. I have fully reviewed the prenatal and intrapartum course. The delivery was at 37 gestational weeks. Outcome: primary cesarean section, low vertical incision. Anesthesia: spinal. Postpartum course has been unremakable. Baby's course has been unremarkable. Baby is feeding by bottle - Similac Sensitive RS. Bleeding no bleeding. Bowel function is normal. Bladder function is normal. Patient is sexually active. Contraception method is Nexplanon. Postpartum depression screening: negative.  She complains of pain in her finger joints when she wakes up in the middle of the night and in AM, but okay during the day. No numbness.  The following portions of the patient's history were reviewed and updated as appropriate: allergies, current medications, past family history, past medical history, past social history, past surgical history and problem list.  Review of Systems Pertinent items are noted in HPI.   Objective:    LMP 02/16/2018 (Exact Date)   General:  alert, cooperative and no distress  Lungs: clear to auscultation bilaterally  Heart:  regular rate and rhythm, S1, S2 normal, no murmur, click, rub or gallop  MSK: Tenderness to joints  Abdomen: soft, non-tender; bowel sounds normal; no masses,  no organomegaly. Incision well healed        Assessment:     Normal postpartum exam. Polyarthritis   Plan:    1. Contraception: Nexplanon - will place in 2 weeks 2. Diclofenac for pain - will need referral to rheumatology when she has insurance. 3. Follow up in: 2 weeks or as needed.

## 2018-12-30 ENCOUNTER — Ambulatory Visit: Payer: Self-pay | Admitting: Family Medicine

## 2019-01-05 ENCOUNTER — Encounter: Payer: Self-pay | Admitting: Family Medicine

## 2019-01-05 ENCOUNTER — Ambulatory Visit (INDEPENDENT_AMBULATORY_CARE_PROVIDER_SITE_OTHER): Payer: Medicaid Other | Admitting: Family Medicine

## 2019-01-05 VITALS — BP 112/62 | HR 62 | Wt 180.8 lb

## 2019-01-05 DIAGNOSIS — Z30017 Encounter for initial prescription of implantable subdermal contraceptive: Secondary | ICD-10-CM

## 2019-01-05 LAB — POCT PREGNANCY, URINE: Preg Test, Ur: NEGATIVE

## 2019-01-05 NOTE — Progress Notes (Signed)
Nexplanon Insertion:  Patient given informed consent, signed copy in the chart, time out was performed. Pregnancy test was negative. Appropriate time out taken.  Patient's left arm was prepped and draped in the usual sterile fashion.. The ruler used to measure and mark insertion area.  Pt was prepped with alcohol swab and then injected with 3 cc of 1% lidocaine with epinephrine.  Pt was prepped with betadine, Implanon removed form packaging,  Device confirmed in needle, then inserted full length of needle and withdrawn per handbook instructions.  Device palpated by physician and patient.  Pt insertion site covered with pressure dressing.   Minimal blood loss.  Pt tolerated the procedure well.

## 2019-01-13 MED ORDER — ETONOGESTREL 68 MG ~~LOC~~ IMPL
68.0000 mg | DRUG_IMPLANT | Freq: Once | SUBCUTANEOUS | Status: AC
  Administered 2019-01-05: 68 mg via SUBCUTANEOUS

## 2019-01-13 NOTE — Addendum Note (Signed)
Addended by: Gwendlyn Deutscher A on: 01/13/2019 12:05 PM   Modules accepted: Orders

## 2019-03-20 IMAGING — US US MFM FETAL BPP W/O NON-STRESS
1 series · 12 of 28 positions shown · non-contrast
Comparison: none

[Series 1: us mfm fetal bpp w/o non-stress · 29 acquisitions, 12 frames shown]
[im 2/29]
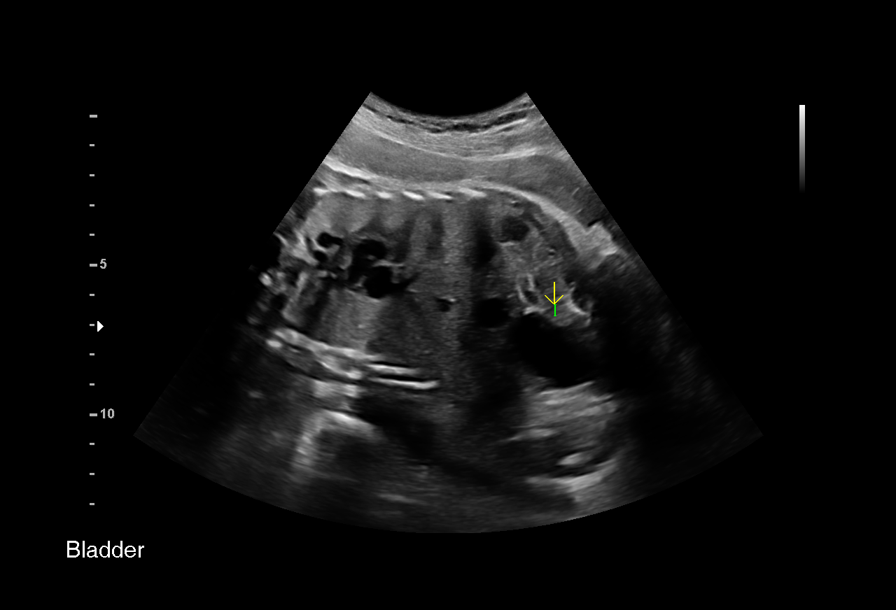
[im 4/29]
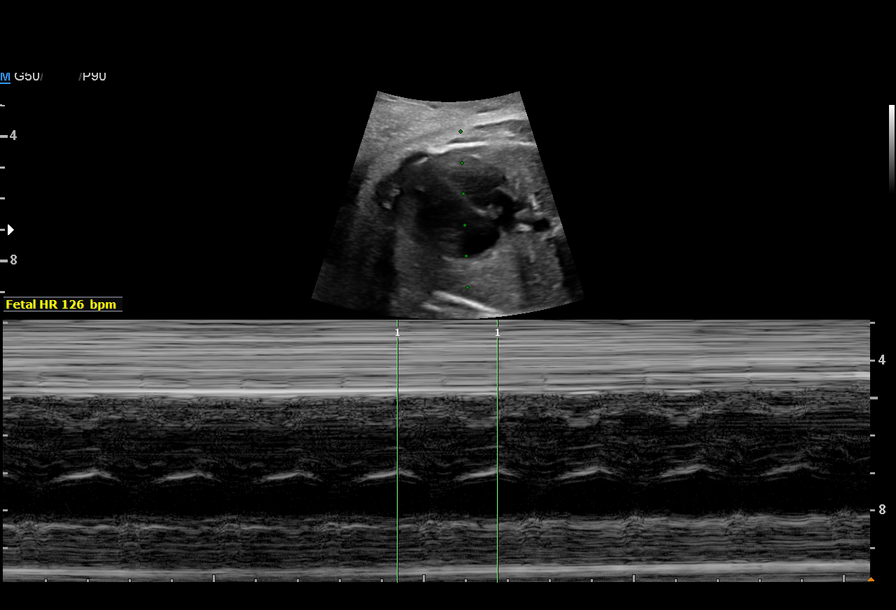
[im 6/29]
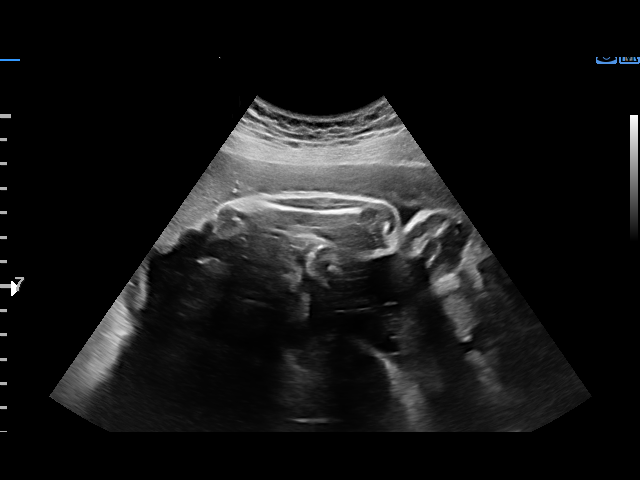
[im 9/29]
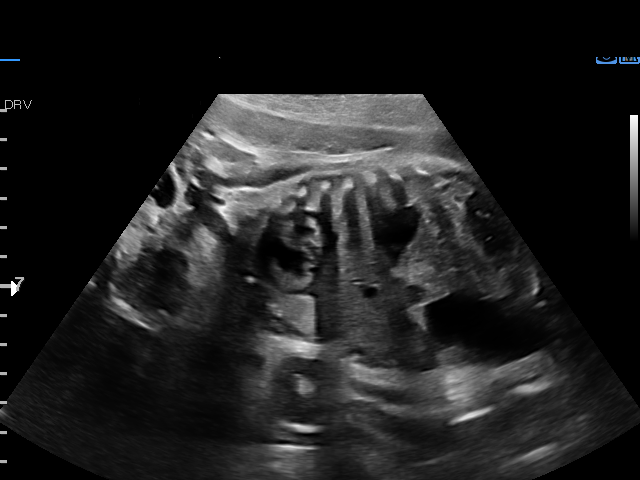
[im 11/29]
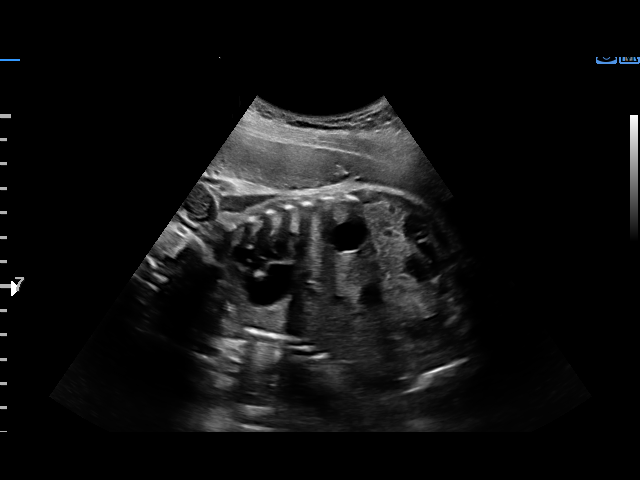
[im 13/29]
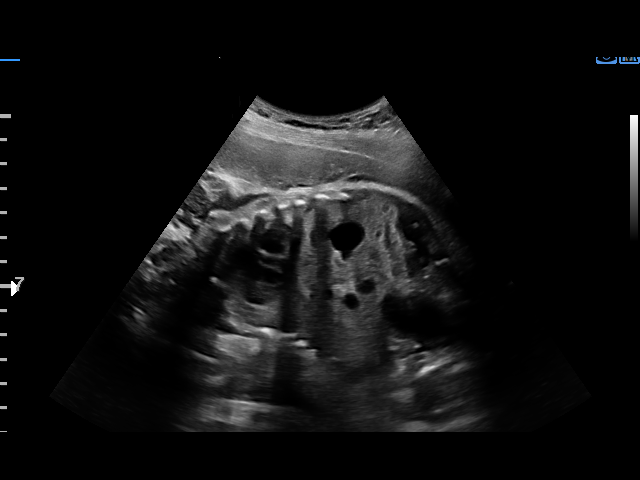
[im 16/29]
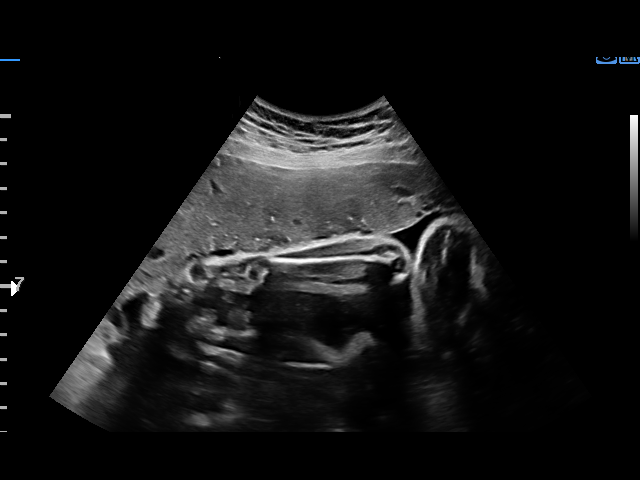
[im 18/29]
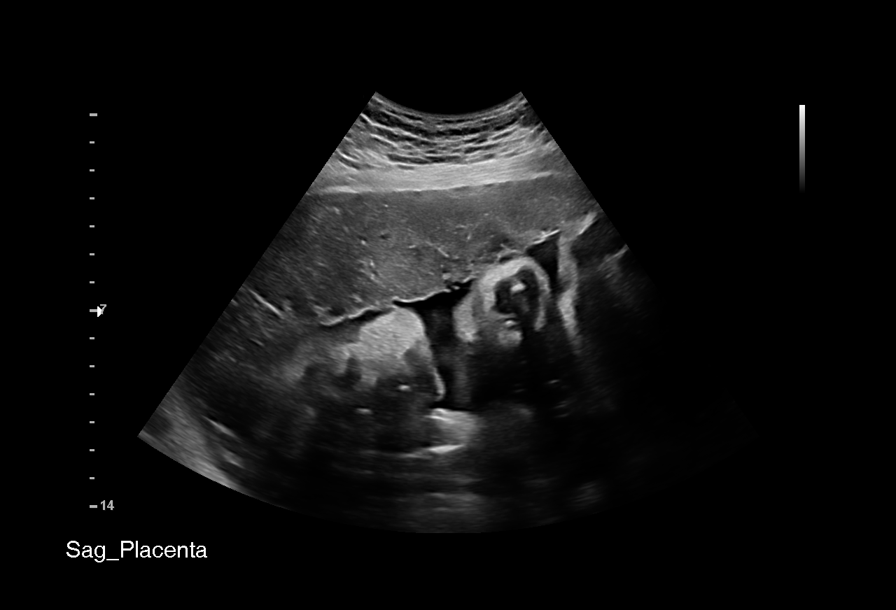
[im 20/29]
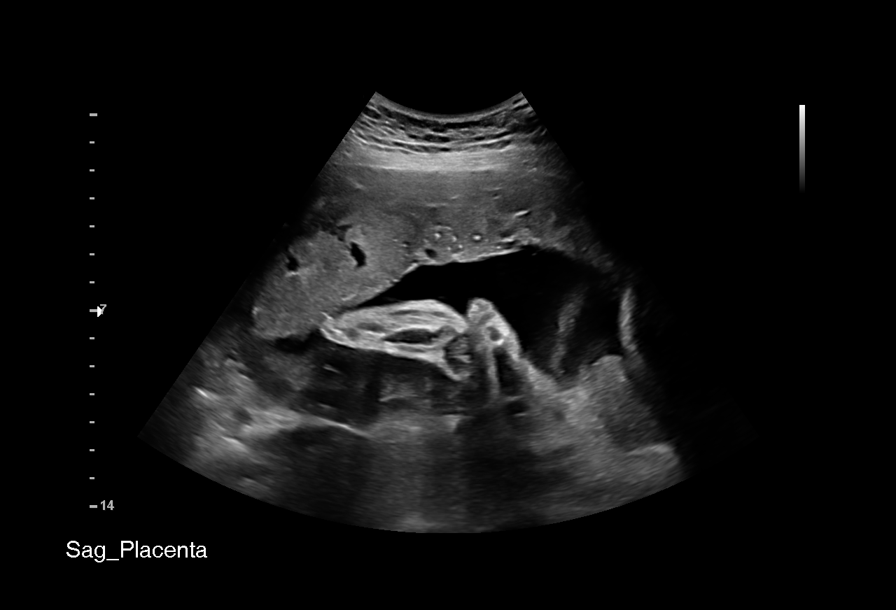
[im 23/29]
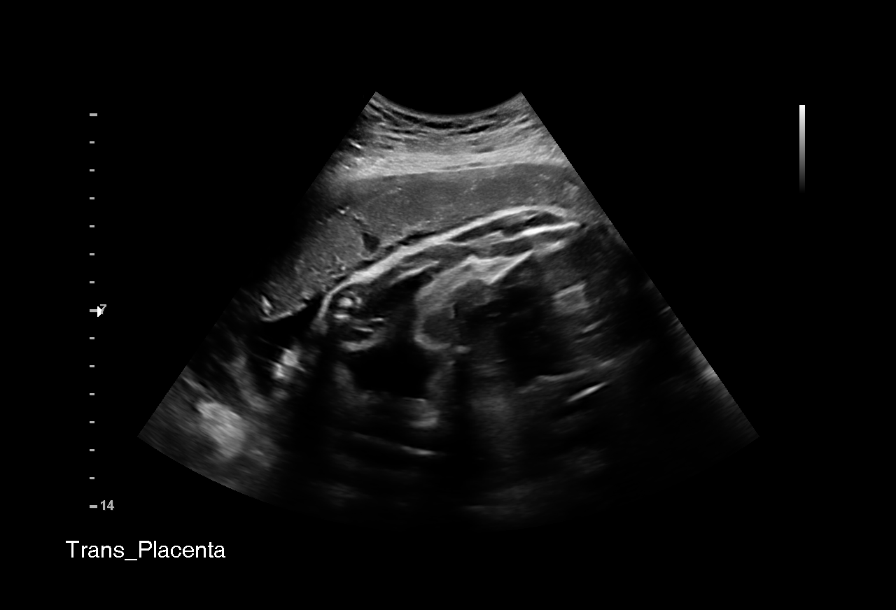
[im 25/29]
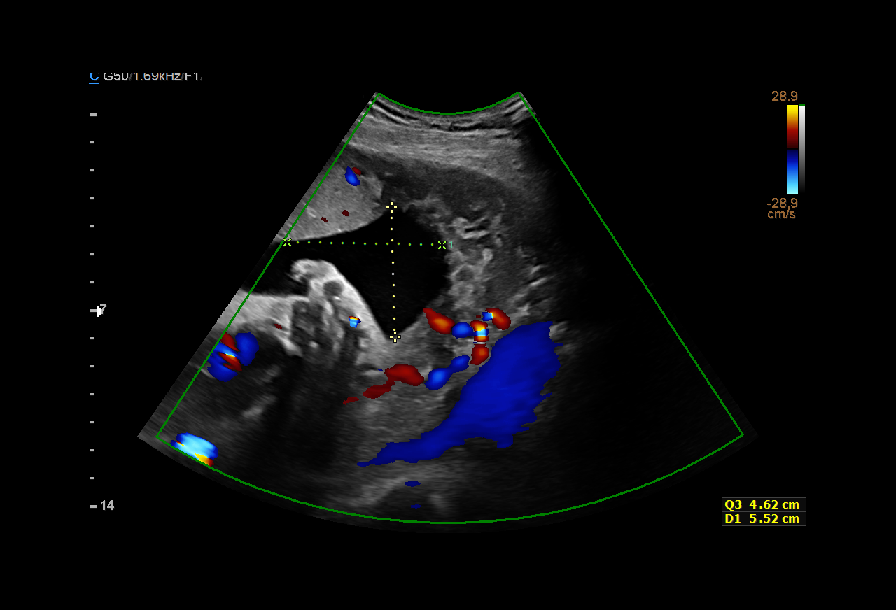
[im 27/29]
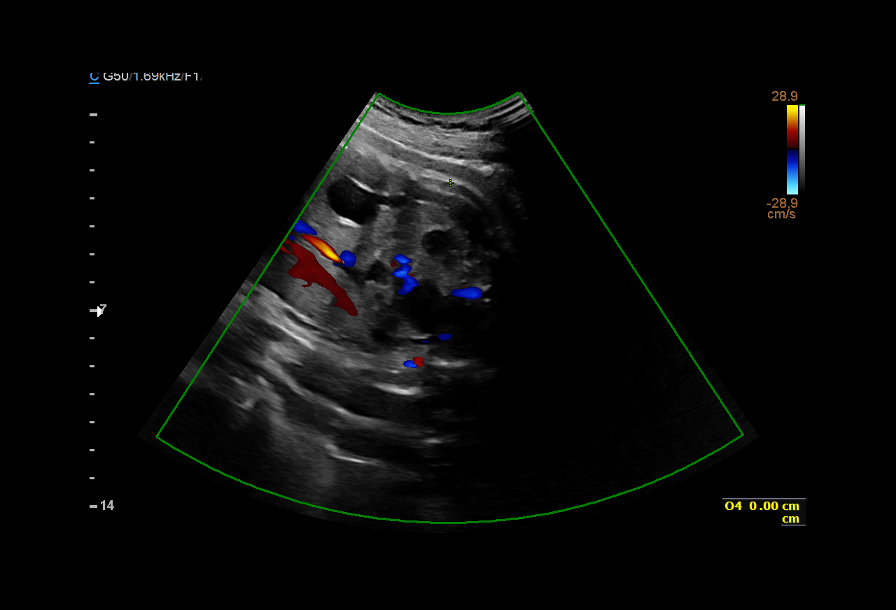

[12 of 28 positions shown; findings below may reference images not displayed]

OB/Gyn Clinic

Indications

Advanced maternal age multigravida 37,
second trimester (low risk Panorama)
34 weeks gestation of pregnancy
Pregnancy complicated by subutex               O99.320, F11.20,
maintenance, antepartum
Tobacco use complicating pregnancy,
second trimester
Fetal abnormality - other known or
suspected (micrognathia)
Vital Signs

BMI:         28.66        Pulse:  74
BP:          104/60
Fetal Evaluation

Num Of Fetuses:         1
Fetal Heart Rate(bpm):  126
Cardiac Activity:       Observed
Presentation:           Breech
Placenta:               Anterior

Amniotic Fluid
AFI FV:      Within normal limits
AFI Sum(cm)     %Tile       Largest Pocket(cm)
9.36            15

RUQ(cm)       RLQ(cm)       LUQ(cm)        LLQ(cm)
3.82          0
Biophysical Evaluation

Amniotic F.V:   Within normal limits       F. Tone:        Observed
F. Movement:    Observed                   Score:          [DATE]
F. Breathing:   Observed
OB History

Gravidity:    4         Term:   2         SAB:   2
Living:       2
Gestational Age

Best:          34w 2d     Det. By:  Previous Ultrasound      EDD:   11/09/18
(04/13/18)
Cervix Uterus Adnexa

Cervix
Not visualized (advanced GA >55wks)
Impression

Amniotic fluid is normal and good fetal activity is seen.
Antenatal testing is reassuring. BPP [DATE].
Profile could not be assessed clearly. Possibility of mild
macrognathia or retrognathia cannot be ruled out.
Recommendations

Continue weekly antenatal testing till delivery.
Inform neonatologist at delivery.

## 2024-09-23 ENCOUNTER — Other Ambulatory Visit: Payer: Self-pay | Admitting: Medical Genetics

## 2024-11-03 ENCOUNTER — Other Ambulatory Visit: Payer: Self-pay

## 2024-12-02 ENCOUNTER — Other Ambulatory Visit
